# Patient Record
Sex: Male | Born: 1990 | Race: Black or African American | Hispanic: No | Marital: Single | Smoking: Current some day smoker
Health system: Southern US, Community
[De-identification: ages and names within clinical notes are randomized; demographics above are authoritative.]

## PROBLEM LIST (undated history)

## (undated) DIAGNOSIS — G47419 Narcolepsy without cataplexy: Secondary | ICD-10-CM

## (undated) DIAGNOSIS — E669 Obesity, unspecified: Secondary | ICD-10-CM

## (undated) DIAGNOSIS — I10 Essential (primary) hypertension: Secondary | ICD-10-CM

---
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
## 2008-12-01 ENCOUNTER — Emergency Department (HOSPITAL_COMMUNITY): Admission: EM | Admit: 2008-12-01 | Discharge: 2008-12-01 | Payer: Self-pay | Admitting: Emergency Medicine

## 2011-02-04 LAB — URINALYSIS, ROUTINE W REFLEX MICROSCOPIC
Bilirubin Urine: NEGATIVE
Glucose, UA: NEGATIVE mg/dL
Hgb urine dipstick: NEGATIVE
Ketones, ur: NEGATIVE mg/dL
Leukocytes, UA: NEGATIVE
Nitrite: NEGATIVE
Protein, ur: 30 mg/dL — AB
Specific Gravity, Urine: 1.035 — ABNORMAL HIGH (ref 1.005–1.030)
Urobilinogen, UA: 1 mg/dL (ref 0.0–1.0)
pH: 7.5 (ref 5.0–8.0)

## 2011-02-04 LAB — CBC
HCT: 45.2 % (ref 39.0–52.0)
Hemoglobin: 15.8 g/dL (ref 13.0–17.0)
MCHC: 34.9 g/dL (ref 30.0–36.0)
MCV: 89.2 fL (ref 78.0–100.0)
Platelets: 281 10*3/uL (ref 150–400)
RBC: 5.06 MIL/uL (ref 4.22–5.81)
RDW: 13.1 % (ref 11.5–15.5)
WBC: 9.3 10*3/uL (ref 4.0–10.5)

## 2011-02-04 LAB — COMPREHENSIVE METABOLIC PANEL
ALT: 56 U/L — ABNORMAL HIGH (ref 0–53)
AST: 43 U/L — ABNORMAL HIGH (ref 0–37)
Albumin: 3.9 g/dL (ref 3.5–5.2)
Alkaline Phosphatase: 60 U/L (ref 39–117)
BUN: 11 mg/dL (ref 6–23)
CO2: 26 mEq/L (ref 19–32)
Calcium: 9 mg/dL (ref 8.4–10.5)
Chloride: 98 mEq/L (ref 96–112)
Creatinine, Ser: 1.08 mg/dL (ref 0.4–1.5)
GFR calc Af Amer: >60 mL/min (ref >60)
GFR calc non Af Amer: >60 mL/min (ref >60)
Glucose, Bld: 89 mg/dL (ref 70–99)
Potassium: 4 mEq/L (ref 3.5–5.1)
Sodium: 133 mEq/L — ABNORMAL LOW (ref 135–145)
Total Bilirubin: 1.1 mg/dL (ref 0.3–1.2)
Total Protein: 7.4 g/dL (ref 6.0–8.3)

## 2011-02-04 LAB — DIFFERENTIAL
Eosinophils Absolute: 0 10*3/uL (ref 0.0–0.7)
Eosinophils Relative: 0 % (ref 0–5)
Lymphs Abs: 0.4 10*3/uL — ABNORMAL LOW (ref 0.7–4.0)
Monocytes Absolute: 0.2 10*3/uL (ref 0.1–1.0)
Monocytes Relative: 3 % (ref 3–12)

## 2011-02-04 LAB — URINE MICROSCOPIC-ADD ON
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
## 2011-05-20 ENCOUNTER — Emergency Department (HOSPITAL_COMMUNITY): Payer: Self-pay

## 2011-05-20 ENCOUNTER — Emergency Department (HOSPITAL_COMMUNITY)
Admission: EM | Admit: 2011-05-20 | Discharge: 2011-05-20 | Disposition: A | Discharge status: Home / Self Care | Payer: Self-pay | Attending: Cardiology | Admitting: Cardiology

## 2011-05-20 DIAGNOSIS — M2560 Stiffness of unspecified joint, not elsewhere classified: Secondary | ICD-10-CM | POA: Insufficient documentation

## 2011-05-20 DIAGNOSIS — S8000XA Contusion of unspecified knee, initial encounter: Secondary | ICD-10-CM | POA: Insufficient documentation

## 2011-05-20 DIAGNOSIS — S83106A Unspecified dislocation of unspecified knee, initial encounter: Secondary | ICD-10-CM | POA: Insufficient documentation

## 2011-05-20 DIAGNOSIS — F411 Generalized anxiety disorder: Secondary | ICD-10-CM | POA: Insufficient documentation

## 2011-05-20 DIAGNOSIS — IMO0002 Reserved for concepts with insufficient information to code with codable children: Secondary | ICD-10-CM | POA: Insufficient documentation

## 2011-05-20 DIAGNOSIS — Y9351 Activity, roller skating (inline) and skateboarding: Secondary | ICD-10-CM | POA: Insufficient documentation

## 2011-05-20 DIAGNOSIS — M25569 Pain in unspecified knee: Secondary | ICD-10-CM | POA: Insufficient documentation

## 2011-11-28 ENCOUNTER — Emergency Department (HOSPITAL_COMMUNITY)
Admission: EM | Admit: 2011-11-28 | Discharge: 2011-11-29 | Disposition: A | Discharge status: Home / Self Care | Payer: Self-pay | Attending: Emergency Medicine | Admitting: Emergency Medicine

## 2011-11-28 ENCOUNTER — Encounter (HOSPITAL_COMMUNITY): Payer: Self-pay | Admitting: *Deleted

## 2011-11-28 DIAGNOSIS — I10 Essential (primary) hypertension: Secondary | ICD-10-CM | POA: Insufficient documentation

## 2011-11-28 DIAGNOSIS — K0889 Other specified disorders of teeth and supporting structures: Secondary | ICD-10-CM

## 2011-11-28 DIAGNOSIS — K089 Disorder of teeth and supporting structures, unspecified: Secondary | ICD-10-CM | POA: Insufficient documentation

## 2011-11-28 HISTORY — DX: Essential (primary) hypertension: I10

## 2011-11-28 NOTE — ED Notes (Signed)
Pt stated that he took 8 500mg  Tylenol at once this evening for tooth pain. Had bottle with him. Labeled and kept at desk. Reported to nurse.

## 2011-11-28 NOTE — ED Notes (Signed)
The pt has had a toothache for 3 days 

## 2011-11-29 MED ORDER — TRAMADOL HCL 50 MG PO TABS: 50.0000 mg | ORAL_TABLET | Freq: Four times a day (QID) | ORAL | Status: AC | PRN

## 2011-11-29 NOTE — ED Provider Notes (Signed)
History     CSN: [id number]  Arrival date & time 11/28/11  2336   First MD Initiated Contact with Patient 11/29/11 0004      Chief Complaint  Patient presents with  . Dental Pain    (Consider location/radiation/quality/duration/timing/severity/associated sxs/prior treatment) HPI Comments: Patient complaining of bilateral lower wisdom tooth pain in uninterrupted wisdom teeth for a while tonight decided to take sleepy and alcohol which did not  Patient is a 21 y.o. male presenting with tooth pain. The history is provided by the patient.  Dental PainThe primary symptoms include mouth pain. Primary symptoms do not include fever. The symptoms began 5 to 7 days ago. The symptoms are unchanged. The symptoms occur constantly.    Past Medical History  Diagnosis Date  . Hypertension     History reviewed. No pertinent past surgical history.  History reviewed. No pertinent family history.  History  Substance Use Topics  . Smoking status: Current Everyday Smoker  . Smokeless tobacco: Not on file  . Alcohol Use: Yes      Review of Systems  Constitutional: Negative for fever.  HENT: Positive for dental problem.   Genitourinary: Negative for dysuria.  Neurological: Negative for dizziness and weakness.    Allergies  Review of patient's allergies indicates no known allergies.  Home Medications   Current Outpatient Rx  Name Route Sig Dispense Refill  . ACETAMINOPHEN 500 MG PO TABS Oral Take 500 mg by mouth daily as needed. For tooth pain    . ALEVE PO Oral Take 1-2 tablets by mouth daily as needed. For pain      BP 136/92  Pulse 61  Temp(Src) 98.1 F (36.7 C) (Oral)  Resp 15  SpO2 100%  Physical Exam  Constitutional: He appears well-developed and well-nourished.  HENT:  Head: Normocephalic.  Mouth/Throat:    Eyes: Pupils are equal, round, and reactive to light.  Cardiovascular: Normal rate.   Pulmonary/Chest: Effort normal.  Abdominal: Soft.    ED Course    Procedures (including critical care time)  Labs Reviewed - No data to display No results found.   No diagnosis found.    MDM  Wisdom tooth pain, uninterrupted        Arman Filter, NP 11/29/11 0056  Arman Filter, NP 11/29/11 [phone]

## 2011-11-29 NOTE — ED Provider Notes (Signed)
Medical screening examination/treatment/procedure(s) were performed by non-physician practitioner and as supervising physician I was immediately available for consultation/collaboration.  Ajani Schnieders K Roseann Kees-Rasch, MD 11/29/11 0700 

## 2013-02-01 IMAGING — CR DG KNEE COMPLETE 4+V*R*
4 series · 4 of 4 positions shown · non-contrast
Comparison: None.

CLINICAL DATA: Right lateral knee pain after fall.

RIGHT KNEE - COMPLETE 4+ VIEW

[t knee ap right]
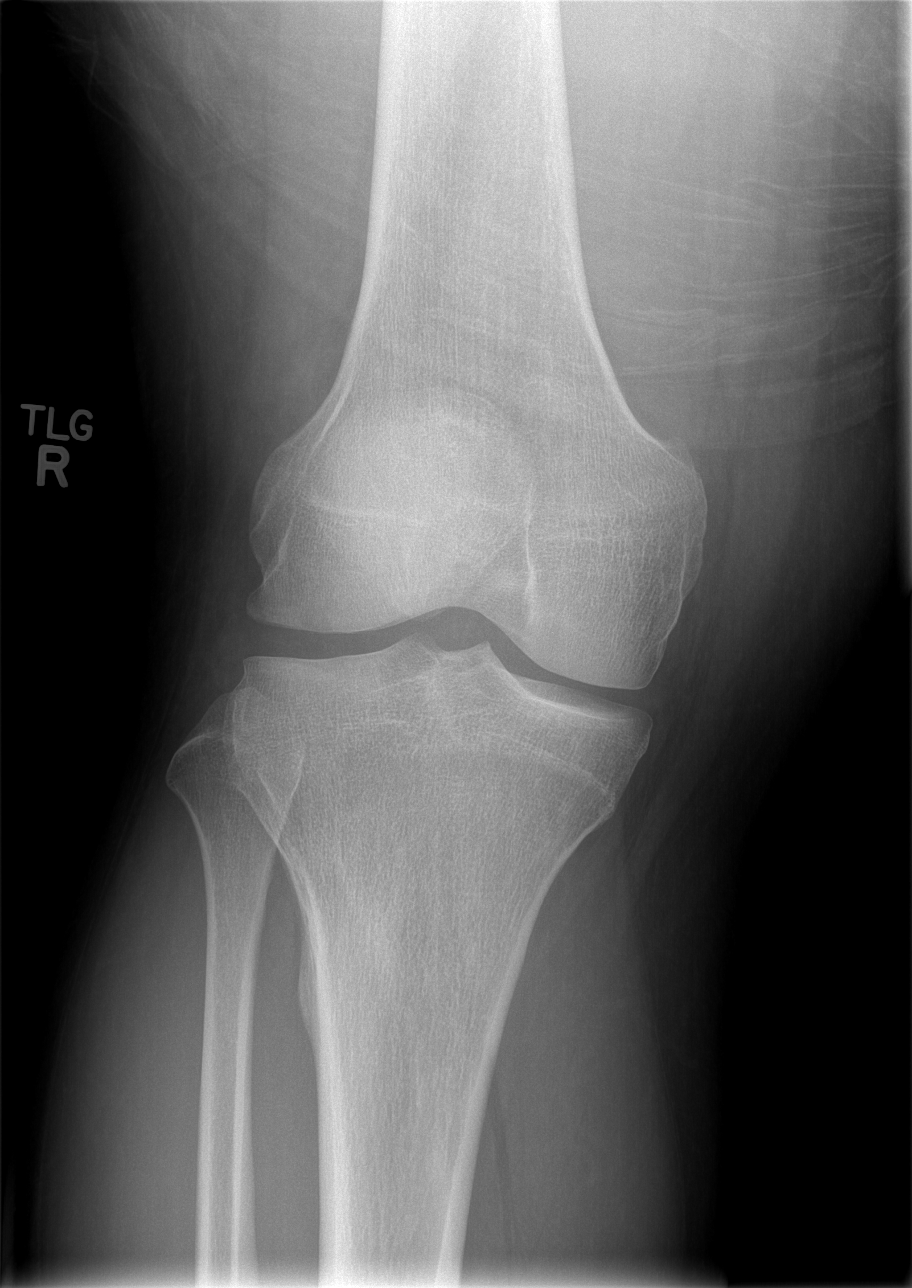

[t knee oblique right * (1 of 2)]
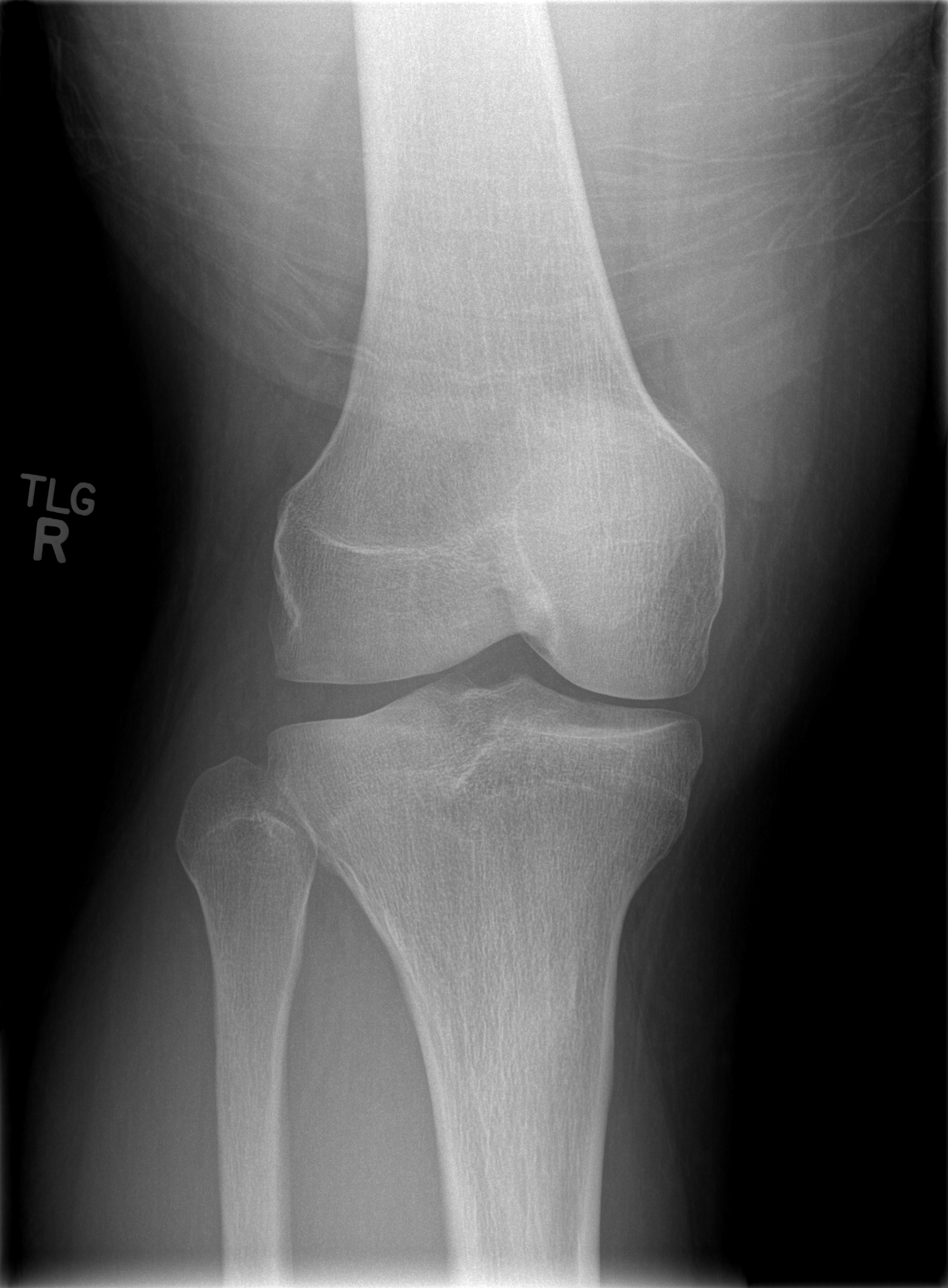

[t knee oblique right * (2 of 2)]
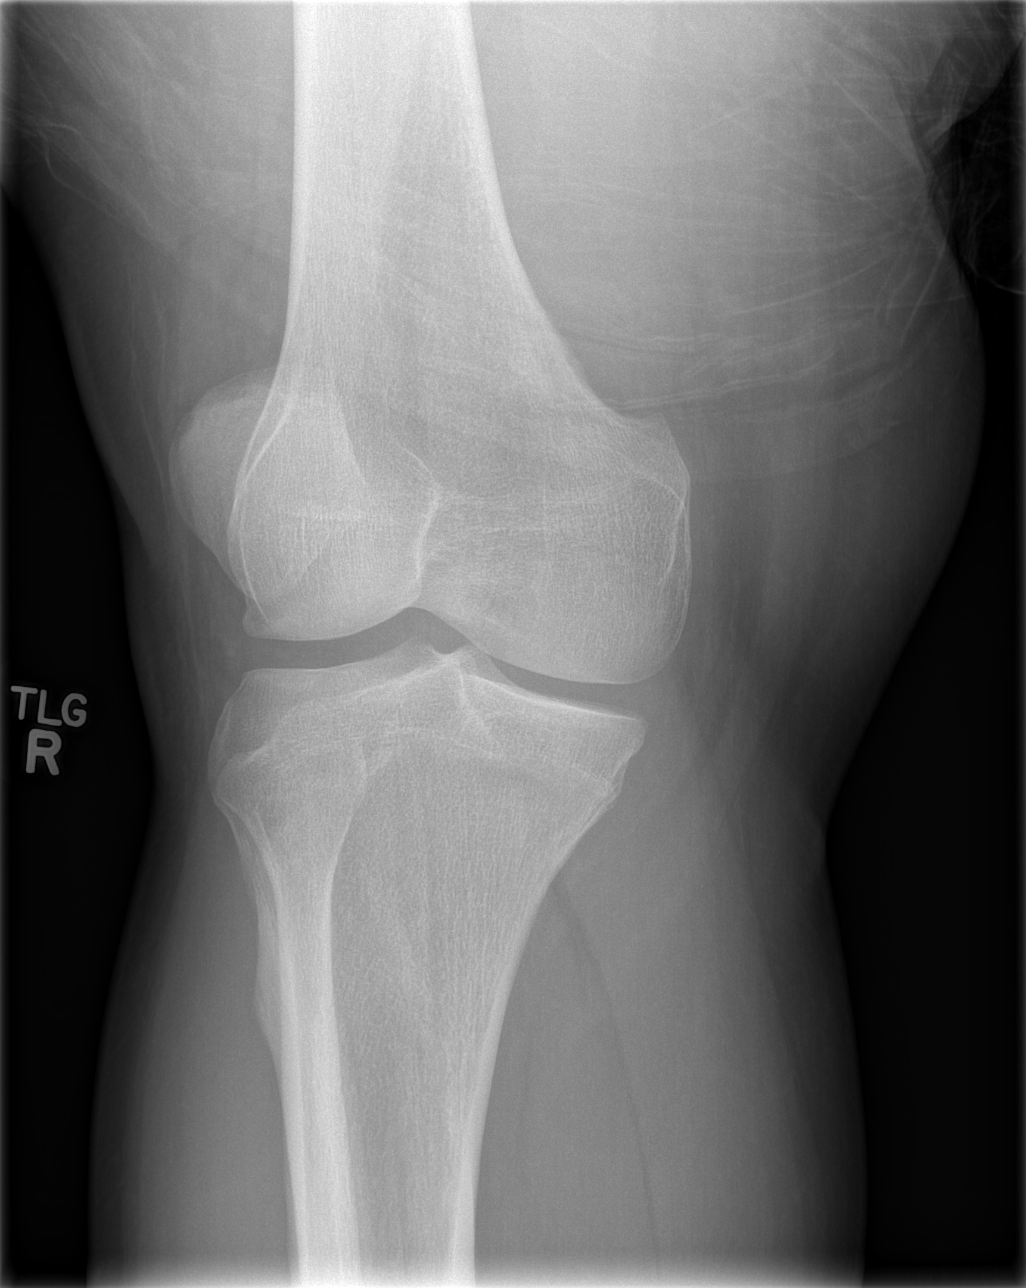

[t knee lat right *]
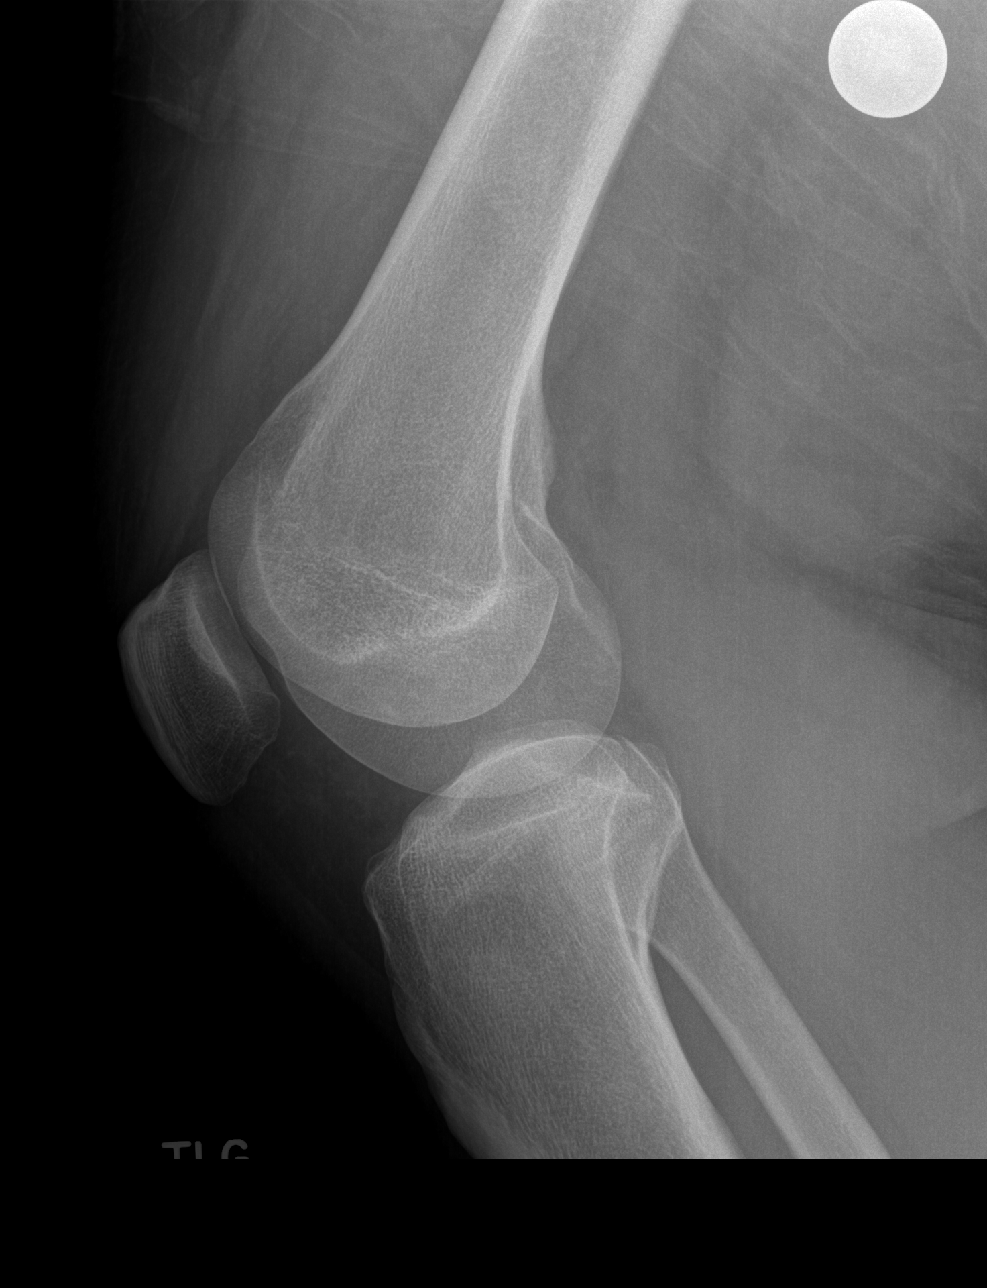

[4 of 4 positions shown; findings below may reference images not displayed]

FINDINGS: No evidence of acute fracture or subluxation.  No focal
bone lesions.  Bone matrix and cortex appear intact.  No abnormal
radiopaque densities in the soft tissues.  No evidence of
effusion.
IMPRESSION: No acute bony abnormalities identified.

## 2013-11-13 ENCOUNTER — Encounter (HOSPITAL_COMMUNITY): Payer: Self-pay | Admitting: Emergency Medicine

## 2013-11-13 ENCOUNTER — Emergency Department (HOSPITAL_COMMUNITY)
Admission: EM | Admit: 2013-11-13 | Discharge: 2013-11-13 | Disposition: A | Discharge status: Home / Self Care | Payer: No Typology Code available for payment source | Attending: Emergency Medicine | Admitting: Emergency Medicine

## 2013-11-13 DIAGNOSIS — F17200 Nicotine dependence, unspecified, uncomplicated: Secondary | ICD-10-CM | POA: Insufficient documentation

## 2013-11-13 DIAGNOSIS — K089 Disorder of teeth and supporting structures, unspecified: Secondary | ICD-10-CM | POA: Insufficient documentation

## 2013-11-13 DIAGNOSIS — K0889 Other specified disorders of teeth and supporting structures: Secondary | ICD-10-CM

## 2013-11-13 DIAGNOSIS — I10 Essential (primary) hypertension: Secondary | ICD-10-CM | POA: Insufficient documentation

## 2013-11-13 DIAGNOSIS — K029 Dental caries, unspecified: Secondary | ICD-10-CM | POA: Insufficient documentation

## 2013-11-13 MED ORDER — PENICILLIN V POTASSIUM 500 MG PO TABS: 500.0000 mg | ORAL_TABLET | Freq: Three times a day (TID) | ORAL | Status: DC

## 2013-11-13 MED ORDER — OXYCODONE-ACETAMINOPHEN 5-325 MG PO TABS: 2.0000 | ORAL_TABLET | ORAL | Status: DC | PRN

## 2013-11-13 NOTE — ED Notes (Signed)
Pt reports dental pain x 3days. He stated "I bit into something wrong and broke a filling on the bottom right side."

## 2013-11-13 NOTE — ED Provider Notes (Signed)
CSN: [id number]     Arrival date & time 11/13/13  0502 History   First MD Initiated Contact with Patient 11/13/13 [phone]     Chief Complaint  Patient presents with  . Dental Pain   (Consider location/radiation/quality/duration/timing/severity/associated sxs/prior Treatment) Patient is a 23 y.o. male presenting with tooth pain. The history is provided by the patient.  Dental Pain Location:  Lower Lower teeth location:  32/RL 3rd molar and 30/RL 1st molar Quality:  Throbbing Severity:  Moderate Onset quality:  Gradual Duration:  3 days Timing:  Constant Progression:  Worsening Chronicity:  New Context: filling fell out   Relieved by:  Nothing Worsened by:  Nothing tried Ineffective treatments:  None tried Associated symptoms: no fever     Past Medical History  Diagnosis Date  . Hypertension    History reviewed. No pertinent past surgical history. No family history on file. History  Substance Use Topics  . Smoking status: Current Every Day Smoker  . Smokeless tobacco: Not on file  . Alcohol Use: Yes    Review of Systems  Constitutional: Negative for fever.  All other systems reviewed and are negative.    Allergies  Review of patient's allergies indicates no known allergies.  Home Medications   Current Outpatient Rx  Name  Route  Sig  Dispense  Refill  . acetaminophen (TYLENOL) 500 MG tablet   Oral   Take 500 mg by mouth daily as needed. For tooth pain         . Naproxen Sodium (ALEVE PO)   Oral   Take 1-2 tablets by mouth daily as needed. For pain          BP 157/92  Pulse 76  Temp(Src) 97.4 F (36.3 C) (Oral)  Resp 16  SpO2 95% Physical Exam  Nursing note and vitals reviewed. Constitutional: He is oriented to person, place, and time. He appears well-developed and well-nourished. No distress.  HENT:  Head: Normocephalic and atraumatic.  Mouth/Throat: Oropharynx is clear and moist.  The right lower first molar is heavily decayed and is missing  the filling the patient states was there previously. He also has inflammation of the gum overlying the impacted third molar.  Neck: Normal range of motion. Neck supple.  Musculoskeletal: Normal range of motion.  Lymphadenopathy:    He has no cervical adenopathy.  Neurological: He is alert and oriented to person, place, and time.  Skin: Skin is warm and dry. He is not diaphoretic.    ED Course  Procedures (including critical care time) Labs Review Labs Reviewed - No data to display Imaging Review No results found.    MDM  No diagnosis found. We'll treat with antibiotics and pain medication and followup with dentistry.    Geoffery Lyonsouglas Kunaal Walkins, MD 11/13/13 [phone]

## 2013-11-13 NOTE — Discharge Instructions (Signed)
Penicillin as prescribed.  Percocet as prescribed as needed for pain.  Followup with dentistry in the next 2-3 days.   Dental Pain A tooth ache may be caused by cavities (tooth decay). Cavities expose the nerve of the tooth to air and hot or cold temperatures. It may come from an infection or abscess (also called a boil or furuncle) around your tooth. It is also often caused by dental caries (tooth decay). This causes the pain you are having. DIAGNOSIS  Your caregiver can diagnose this problem by exam. TREATMENT   If caused by an infection, it may be treated with medications which kill germs (antibiotics) and pain medications as prescribed by your caregiver. Take medications as directed.  Only take over-the-counter or prescription medicines for pain, discomfort, or fever as directed by your caregiver.  Whether the tooth ache today is caused by infection or dental disease, you should see your dentist as soon as possible for further care. SEEK MEDICAL CARE IF: The exam and treatment you received today has been provided on an emergency basis only. This is not a substitute for complete medical or dental care. If your problem worsens or new problems (symptoms) appear, and you are unable to meet with your dentist, call or return to this location. SEEK IMMEDIATE MEDICAL CARE IF:   You have a fever.  You develop redness and swelling of your face, jaw, or neck.  You are unable to open your mouth.  You have severe pain uncontrolled by pain medicine. MAKE SURE YOU:   Understand these instructions.  Will watch your condition.  Will get help right away if you are not doing well or get worse. Document Released: 10/06/2005 Document Revised: 12/29/2011 Document Reviewed: 05/24/2008 [facility] Patient Information 2014 [place], [place].    Emergency Department Resource Guide 1) Find a Doctor and Pay Out of Pocket Although you won't have to find out who is covered by your insurance plan, it is a  good idea to ask around and get recommendations. You will then need to call the office and see if the doctor you have chosen will accept you as a new patient and what types of options they offer for patients who are self-pay. Some doctors offer discounts or will set up payment plans for their patients who do not have insurance, but you will need to ask so you aren't surprised when you get to your appointment.  2) Contact Your Local Health Department Not all health departments have doctors that can see patients for sick visits, but many do, so it is worth a call to see if yours does. If you don't know where your local health department is, you can check in your phone book. The CDC also has a tool to help you locate your state's health department, and many state websites also have listings of all of their local health departments.  3) Find a Walk-in Clinic If your illness is not likely to be very severe or complicated, you may want to try a walk in clinic. These are popping up all over the country in pharmacies, drugstores, and shopping centers. They're usually staffed by nurse practitioners or physician assistants that have been trained to treat common illnesses and complaints. They're usually fairly quick and inexpensive. However, if you have serious medical issues or chronic medical problems, these are probably not your best option.  No Primary Care Doctor: - Call Health Connect at  [phone] - they can help you locate a primary care doctor that  accepts  your insurance, provides certain services, etc. - Physician Referral Service- [phone]  Chronic Pain Problems: Organization         Address  Phone   Notes  [facility]  [phone] Patients need to be referred by their primary care doctor.   Medication Assistance: Organization         Address  Phone   Notes  [facility] Medication [facility] [address] [place], [place] [address] [phone] --Must be a resident of [facility] -- Must have NO insurance coverage whatsoever (no Medicaid/ Medicare, etc.) -- The pt. MUST have a primary care doctor that directs their care regularly and follows them in the community   [facility]  [phone]   [employer]  [phone]    Agencies that provide inexpensive medical care: Organization         Address  Phone   Notes  [facility]  [phone]   [facility]    [phone]   [facility] [address] [place], [place] [address] [phone]   [facility] [address] [place] [place] [phone]   Planned Parenthood    [phone]   [facility]    [phone]   Community Health and [facility]  [address], [place] Phone:  [phone], Fax:  [phone] Hours of Operation:  9 am - 6 pm, M-F.  Also accepts Medicaid/Medicare and self-pay.  [facility]  [address], [place] Phone: [phone], Fax: [phone]. Hours of Operation:  8:30 am - 5:30 pm, M-F.  Also accepts Medicaid and self-pay.  [facility] [place] [address] [place] Phone: [phone]   [facility] [address] Natasha Bence [place], [place] [phone], Ext. [phone] Mondays & Thursdays: 7-9 AM.  First 15 patients are seen on a first come, first serve basis.    Medicaid-accepting [facility] [place] Providers:  Organization         Address  Phone   Notes  [facility] [address], [place] [phone] Also accepts self-pay patients.  [facility] [address] Laurell Josephs [place], [place]  [phone]   [facility] [address], [place] [phone]   [facility] [address], [place] [phone]   Renaye Rakers [address], [place]   [phone] Only accepts [place] patients after they have their name applied to their card.   Self-Pay (no insurance) in [facility]:  Organization         Address  Phone   Notes  Sickle Cell Patients, [facility] [address] [place], [place] [phone]   [facility] [address] [place], [place] [phone]   [facility] [place]  [address], [place] [phone]   [facility]/Dr. Osei-Bonsu  [address], [place] or [address], [place] [phone] Phone number for both [place] and [place] locations is the same.  [facility] and [facility] [address] [place] [phone]   [facility] [address] [place] or [address] [phone]   [facility]  [address], [place] [phone], phone; [phone], fax Sees patients 1st and 3rd Saturday of every month.  Must not qualify for public or private insurance (i.e. Medicaid, Medicare, [place] Health Choice, Veterans' Benefits)  Household income should be no more than 200% of the poverty level The clinic cannot treat you if you are pregnant or think you are pregnant  Sexually transmitted diseases are not treated at the clinic.    Dental Care: Organization         Address  Phone  Notes  [facility] [address], [place] [phone] Accepts children up to age 25 who are enrolled in [place] or [place] Health Choice; pregnant women with a Medicaid card; and children who have applied for Medicaid or [place] Health Choice, but were declined, whose parents can pay a reduced fee at time of service.  [facility] [place]  [address], [place] [phone] Accepts children up to age 25 who are enrolled in [place] or [place] [facility]  Choice; pregnant women with a Medicaid card; and children who have applied for Medicaid or [place] Health Choice, but were declined, whose parents can pay a reduced fee at time of service.  [facility]  [address], [place] [phone] Patients are seen by appointment only. Walk-ins are not accepted. [facility] will see patients 25 years of age and older. Monday - Tuesday (8am-5pm) Most Wednesdays (8:30-5pm) $30 per visit, cash only  [facility]  [address], [facility] [phone] Patients are seen by appointment only. Walk-ins are not accepted. [facility] will see patients 25 years of age and older. One Wednesday Evening (Monthly: Volunteer Based).  $30 per visit, cash only  [employer] [facility] [facility]  [phone] for adults; Children under age 704, call Graduate Pediatric Dentistry at [phone]. Children aged 104-14, please call [phone] to request a pediatric application.  Dental services are provided in all areas of dental care including fillings, crowns and bridges, complete and partial dentures, implants, gum treatment, root canals, and extractions. Preventive care is also provided. Treatment is provided to both adults and children. Patients are selected via a lottery and there is often a waiting list.   [facility] [address], [place]  [phone]) [phone] [email or web address]   [facility] [address], [place], [place] [phone], Ext. [phone] Second and Fourth Thursday of each month, opens at 6:30 AM; Clinic ends at 9 AM.  Patients are seen on a first-come first-served basis, and a limited number are seen during each clinic.   [facility]  [address], [place], [place] [phone]   Eligibility Requirements You must have lived in [place], [place], or [place] counties for at least the last three months.   You cannot be eligible for state or  federal sponsored [place]healthcare insurance, including CIGNAVeterans Administration, [place]icaid, or [employer]icare.   You generally cannot be eligible for healthcare insurance through your employer.    How to apply: Eligibility screenings are held every Tuesday and Wednesday afternoon from 1:00 pm until 4:00 pm. You do not need an appointment for the interview!  [facility] [address], [place], [place] [phone]   [facility]  [phone]   [facility]  [phone]   [facility]  [phone]  Behavioral Health Resources in the Community: °Intensive Outpatient Programs °Organization         Address  Phone  Notes  °[place] [facility] [address], [place], [place] [phone]   °[facility] [address], [place], [place] [phone]   °[facility] [address], [place], [place] ° 336-882-2125   °[facility] [address],  °[place], [place] 1-800-853-5163 or 336-641-4981   °Substance Abuse Resources °Organization         Address  Phone  Notes  °[facility]  336-882-2125   °[facility]  336-784-9470   °[facility]  336-285-9073   °[facility]  336-845-3988   °Residential & Outpatient Substance Abuse Program  1-800-659-3381   °Psychological Services °Organization         Address  Phone  Notes  °[facility]  336- [phone]   °[facility]  336- 378-7881   °[facility] [address], [place] 1-800-853-5163 or 336-641-4981   ° °Mobile Crisis Teams °Organization         Address  Phone  Notes  °[facility], Mobile Crisis Care Unit  1-877-626-1772   °Assertive °Psychotherapeutic Services ° [address] [place], [place] 336-834-9664   °Sharon DeEsch [address] °[place] 336-554-5454   ° °Self-Help/Support Groups °Organization         Address  Phone              Notes  °[facility] [place] - variety of support groups  336- 373-1402 Call for more information  °Narcotics Anonymous (NA), [facility] [address], °[place]  2 meetings at this location  ° °Residential Treatment Programs °Organization         Address  Phone  Notes  °[facility] [address],    °[place]  1-866-801-8205   °[facility] ° [address], [place], [place] [phone]   °[facility] [address], [place] 336-845-3988 Admissions: 8am-3pm M-F  °[facility] [address]    °[place], [place] 336-841-1104   °[facility] [address], [place], [place] 336-379-7146   °[facility] [address]  °[place], [place] 336-285-9073   °[facility] - Intensive Outpatient [address], [place], [place] 336-852-3033   °[facility] ([facility] [address]  °[place], [place] 1-877-615-2722 or 336-784-9470   °[facility] ([facility]) [address] [place], [place] 336-227-7417 Accepts Medicaid  °[facility] [address]  ° [place] 1-800-659-3381 Substance Abuse/Addiction Treatment  ° °[place] Behavioral Health Resources °Organization         Address  Phone  Notes  °[facility]  (888) 581-9988   °Julie Brannon, PhD [address] [place], [place]   (336) 349-5553 or (336) 951-0000   °[facility]   [address] °[place], [place] (336) 349-4454   °[facility] [address], [place],  (336) 342-8316 Insurance/Medicaid/sponsorship through [facility]  °[facility] [address]                                    [place],  (336) [phone] Therapy/tele-psych/case  °[facility] [address]  ° ,   [place] [phone]    Dr. Lolly Mustache  [phone]   [facility]  [facility]  Dept. 1) [address], [place] 2) [address] [place] 3)  [address], [place] [phone]  [phone]   [facility] Child Abuse Hotline 248-763-7218 or [phone] (After Hours)

## 2014-04-21 ENCOUNTER — Encounter (HOSPITAL_COMMUNITY): Payer: Self-pay | Admitting: Emergency Medicine

## 2014-04-21 ENCOUNTER — Emergency Department (HOSPITAL_COMMUNITY)
Admission: EM | Admit: 2014-04-21 | Discharge: 2014-04-21 | Disposition: A | Discharge status: Home / Self Care | Payer: No Typology Code available for payment source | Attending: Emergency Medicine | Admitting: Emergency Medicine

## 2014-04-21 DIAGNOSIS — K089 Disorder of teeth and supporting structures, unspecified: Secondary | ICD-10-CM | POA: Insufficient documentation

## 2014-04-21 DIAGNOSIS — F17200 Nicotine dependence, unspecified, uncomplicated: Secondary | ICD-10-CM | POA: Insufficient documentation

## 2014-04-21 DIAGNOSIS — E669 Obesity, unspecified: Secondary | ICD-10-CM | POA: Insufficient documentation

## 2014-04-21 DIAGNOSIS — I10 Essential (primary) hypertension: Secondary | ICD-10-CM | POA: Insufficient documentation

## 2014-04-21 DIAGNOSIS — G47419 Narcolepsy without cataplexy: Secondary | ICD-10-CM | POA: Insufficient documentation

## 2014-04-21 DIAGNOSIS — K029 Dental caries, unspecified: Secondary | ICD-10-CM | POA: Insufficient documentation

## 2014-04-21 DIAGNOSIS — K0889 Other specified disorders of teeth and supporting structures: Secondary | ICD-10-CM

## 2014-04-21 HISTORY — DX: Narcolepsy without cataplexy: G47.419

## 2014-04-21 HISTORY — DX: Obesity, unspecified: E66.9

## 2014-04-21 MED ORDER — HYDROCODONE-ACETAMINOPHEN 5-325 MG PO TABS: ORAL_TABLET | ORAL | Status: DC

## 2014-04-21 MED ORDER — PENICILLIN V POTASSIUM 500 MG PO TABS: 500.0000 mg | ORAL_TABLET | Freq: Three times a day (TID) | ORAL | Status: DC

## 2014-04-21 MED ORDER — NAPROXEN 500 MG PO TABS: 500.0000 mg | ORAL_TABLET | Freq: Two times a day (BID) | ORAL | Status: DC

## 2014-04-21 NOTE — ED Provider Notes (Signed)
Medical screening examination/treatment/procedure(s) were performed by non-physician practitioner and as supervising physician I was immediately available for consultation/collaboration.   EKG Interpretation None       Doug SouSam Lavinia Mcneely, MD 04/21/14 [phone]

## 2014-04-21 NOTE — ED Notes (Signed)
Pt A&OX4, ambulatory at d/c with steady gait, NAD, declined wheelchair. 

## 2014-04-21 NOTE — ED Notes (Signed)
Pt. reports right upper/lower molar pain onset yesterday unrelieved by OTC pain medication .

## 2014-04-21 NOTE — ED Provider Notes (Signed)
CSN: [id number]     Arrival date & time 04/21/14  0137 History   First MD Initiated Contact with Patient 04/21/14 0234     No chief complaint on file.    (Consider location/radiation/quality/duration/timing/severity/associated sxs/prior Treatment) HPI Comments: Patient presents with complaint of dental pain that began 2 days ago and has gradually become worse. Patient states the pain is in his right molars. He is taken ibuprofen without relief. No facial or neck swelling. No trouble breathing. No fever. Patient has had pain in this area in the past. He has a Education officer, communitydentist.   The history is provided by the patient.    Past Medical History  Diagnosis Date  . Hypertension   . Obesity   . Narcolepsy    History reviewed. No pertinent past surgical history. No family history on file. History  Substance Use Topics  . Smoking status: Current Every Day Smoker  . Smokeless tobacco: Not on file  . Alcohol Use: Yes    Review of Systems  Constitutional: Negative for fever.  HENT: Positive for dental problem. Negative for ear pain, facial swelling, sore throat and trouble swallowing.   Respiratory: Negative for shortness of breath and stridor.   Musculoskeletal: Negative for neck pain.  Skin: Negative for color change.  Neurological: Negative for headaches.      Allergies  Review of patient's allergies indicates no known allergies.  Home Medications   Prior to Admission medications   Medication Sig Start Date End Date Taking? Authorizing Provider  HYDROcodone-acetaminophen (NORCO/VICODIN) 5-325 MG per tablet Take 1-2 tablets every 6 hours as needed for severe pain 04/21/14   Renne CriglerJoshua Solace Wendorff, PA-C  naproxen (NAPROSYN) 500 MG tablet Take 1 tablet (500 mg total) by mouth 2 (two) times daily. 04/21/14   Renne CriglerJoshua Essie Gehret, PA-C  penicillin v potassium (VEETID) 500 MG tablet Take 1 tablet (500 mg total) by mouth 3 (three) times daily. 04/21/14   Renne CriglerJoshua Hesper Venturella, PA-C   BP 135/81  Pulse 80  Temp(Src) 98.7  F (37.1 C) (Oral)  Resp 22  Ht 5\' 9"  (1.753 m)  Wt 333 lb (151.048 kg)  BMI 49.15 kg/m2  SpO2 97% Physical Exam  Nursing note and vitals reviewed. Constitutional: He appears well-developed and well-nourished.  HENT:  Head: Normocephalic and atraumatic.  Right Ear: Tympanic membrane, external ear and ear canal normal.  Left Ear: Tympanic membrane, external ear and ear canal normal.  Nose: Nose normal.  Mouth/Throat: Uvula is midline, oropharynx is clear and moist and mucous membranes are normal. No trismus in the jaw. Abnormal dentition. Dental caries present. No dental abscesses or uvula swelling. No tonsillar abscesses.  Patient with R maxillary/mandibular tooth pain and tenderness to palpation in area of posterior molars. Mild gingival swelling and erythema noted on exam. Teeth are in poor repair. Multiple filled and broken teeth.  Eyes: Pupils are equal, round, and reactive to light.  Neck: Normal range of motion. Neck supple.  No neck swelling or Lugwig's angina  Neurological: He is alert.  Skin: Skin is warm and dry.  Psychiatric: He has a normal mood and affect.    ED Course  Procedures (including critical care time) Labs Review Labs Reviewed - No data to display  Imaging Review No results found.   EKG Interpretation None      2:46 AM Patient seen and examined. Work-up initiated. Medications ordered.   Vital signs reviewed and are as follows: Filed Vitals:   04/21/14 0235  BP: 135/81  Pulse: 80  Temp:  Resp: 22    Patient counseled to take prescribed medications as directed, return with worsening facial or neck swelling, and to follow-up with their dentist as soon as possible.   Patient counseled on use of narcotic pain medications. Counseled not to combine these medications with others containing tylenol. Urged not to drink alcohol, drive, or perform any other activities that requires focus while taking these medications. The patient verbalizes  understanding and agrees with the plan.    MDM   Final diagnoses:  Toothache   Patient with toothache. No fever. Exam unconcerning for Ludwig's angina or other deep tissue infection in neck.   As there is gum swelling, erythema, will treat with antibiotic and pain medicine. Urged patient to follow-up with dentist.      Renne CriglerJoshua Duc Crocket, PA-C 04/21/14 [id number]

## 2014-04-21 NOTE — Discharge Instructions (Signed)
Please read and follow all provided instructions.  Your diagnoses today include:  1. Toothache     The exam and treatment you received today has been provided on an emergency basis only. This is not a substitute for complete medical or dental care.  Tests performed today include:  Vital signs. See below for your results today.   Medications prescribed:   Vicodin (hydrocodone/acetaminophen) - narcotic pain medication  DO NOT drive or perform any activities that require you to be awake and alert because this medicine can make you drowsy. BE VERY CAREFUL not to take multiple medicines containing Tylenol (also called acetaminophen). Doing so can lead to an overdose which can damage your liver and cause liver failure and possibly death.   Penicillin - antibiotic  You have been prescribed an antibiotic medicine: take the entire course of medicine even if you are feeling better. Stopping early can cause the antibiotic not to work.   Naproxen - anti-inflammatory pain medication  Do not exceed 500mg  naproxen every 12 hours, take with food  You have been prescribed an anti-inflammatory medication or NSAID. Take with food. Take smallest effective dose for the shortest duration needed for your pain. Stop taking if you experience stomach pain or vomiting.   Take any prescribed medications only as directed.  Home care instructions:  Follow any educational materials contained in this packet.  Follow-up instructions: Please follow-up with your dentist for further evaluation of your symptoms.   Dental Assistance: See below for dental referrals  Return instructions:   Please return to the Emergency Department if you experience worsening symptoms.  Please return if you develop a fever, you develop more swelling in your face or neck, you have trouble breathing or swallowing food.  Please return if you have any other emergent concerns.  Additional Information:  Your vital signs today  were: BP 135/81   Pulse 80   Temp(Src) 98.7 F (37.1 C) (Oral)   Resp 22   Ht 5\' 9"  (1.753 m)   Wt 333 lb (151.048 kg)   BMI 49.15 kg/m2   SpO2 97% If your blood pressure (BP) was elevated above 135/85 this visit, please have this repeated by your doctor within one month. -------------- Dental Care: Organization         Address  Phone  Notes  [facility] [address], [place] [phone] Accepts children up to age 25 who are enrolled in [place] or [place] Health Choice; pregnant women with a Medicaid card; and children who have applied for Medicaid or [place] Health Choice, but were declined, whose parents can pay a reduced fee at time of service.  [facility] [place]  [address], [place] [phone] Accepts children up to age 25 who are enrolled in [place] or [place] Health Choice; pregnant women with a Medicaid card; and children who have applied for Medicaid or [place] Health Choice, but were declined, whose parents can pay a reduced fee at time of service.  [facility]  [address], [place] [phone] Patients are seen by appointment only. Walk-ins are not accepted. [facility] will see patients 25 years of age and older. Monday - Tuesday (8am-5pm) Most Wednesdays (8:30-5pm) $30 per visit, cash only  [facility]  [address], [facility] [phone] Patients are seen by appointment only. Walk-ins are not accepted. [facility] will see  patients 31 years of age and older. One Wednesday Evening (Monthly: Volunteer Based).  $30 per visit, cash only  [place]  [phone] for adults; Children under age 87, call Graduate Pediatric Dentistry at [phone]. Children aged 13-14, please call [phone] to request a pediatric application.  Dental services are provided in all areas of dental  care including fillings, crowns and bridges, complete and partial dentures, implants, gum treatment, root canals, and extractions. Preventive care is also provided. Treatment is provided to both adults and children. Patients are selected via a lottery and there is often a waiting list.   [facility] [address], [place]  [phone] [email or web address]   [facility] [address] [place], [place] [phone], [address]. [phone] Second and Fourth Thursday of each month, opens at 6:30 AM; Clinic ends at 9 AM.  Patients are seen on a first-come first-served basis, and a limited number are seen during each clinic.   [facility]  [address] [place], [place] [phone]   Eligibility Requirements You must have lived in [place], [place], or [place] counties for at least the last three months.   You cannot be eligible for state or federal sponsored [employer], including [employer], [place], or [employer].   You generally cannot be eligible for healthcare insurance through your employer.    How to apply: Eligibility screenings are held every Tuesday and Wednesday afternoon from 1:00 pm until 4:00 pm. You do not need an appointment for the interview!  [facility] [address] [place], [place]   [place]  [place]  [place]  [phone]

## 2015-04-10 ENCOUNTER — Encounter (HOSPITAL_COMMUNITY): Payer: Self-pay | Admitting: Emergency Medicine

## 2015-04-10 ENCOUNTER — Emergency Department (HOSPITAL_COMMUNITY)
Admission: EM | Admit: 2015-04-10 | Discharge: 2015-04-10 | Disposition: A | Discharge status: Home / Self Care | Payer: No Typology Code available for payment source | Attending: Emergency Medicine | Admitting: Emergency Medicine

## 2015-04-10 DIAGNOSIS — S0501XA Injury of conjunctiva and corneal abrasion without foreign body, right eye, initial encounter: Secondary | ICD-10-CM | POA: Diagnosis not present

## 2015-04-10 DIAGNOSIS — Z791 Long term (current) use of non-steroidal anti-inflammatories (NSAID): Secondary | ICD-10-CM | POA: Insufficient documentation

## 2015-04-10 DIAGNOSIS — H5711 Ocular pain, right eye: Secondary | ICD-10-CM | POA: Diagnosis present

## 2015-04-10 DIAGNOSIS — Y999 Unspecified external cause status: Secondary | ICD-10-CM | POA: Insufficient documentation

## 2015-04-10 DIAGNOSIS — E669 Obesity, unspecified: Secondary | ICD-10-CM | POA: Insufficient documentation

## 2015-04-10 DIAGNOSIS — X58XXXA Exposure to other specified factors, initial encounter: Secondary | ICD-10-CM | POA: Insufficient documentation

## 2015-04-10 DIAGNOSIS — Y929 Unspecified place or not applicable: Secondary | ICD-10-CM | POA: Diagnosis not present

## 2015-04-10 DIAGNOSIS — I10 Essential (primary) hypertension: Secondary | ICD-10-CM | POA: Insufficient documentation

## 2015-04-10 DIAGNOSIS — Y939 Activity, unspecified: Secondary | ICD-10-CM | POA: Diagnosis not present

## 2015-04-10 DIAGNOSIS — Z87891 Personal history of nicotine dependence: Secondary | ICD-10-CM | POA: Diagnosis not present

## 2015-04-10 DIAGNOSIS — H109 Unspecified conjunctivitis: Secondary | ICD-10-CM | POA: Diagnosis not present

## 2015-04-10 DIAGNOSIS — R51 Headache: Secondary | ICD-10-CM | POA: Diagnosis not present

## 2015-04-10 MED ORDER — FLUORESCEIN SODIUM 1 MG OP STRP
1.0000 | ORAL_STRIP | Freq: Once | OPHTHALMIC | Status: AC
  Administered 2015-04-10: 1 via OPHTHALMIC
  Filled 2015-04-10: qty 1

## 2015-04-10 MED ORDER — CIPROFLOXACIN HCL 0.3 % OP SOLN
1.0000 [drp] | OPHTHALMIC | Status: DC
  Administered 2015-04-10: 1 [drp] via OPHTHALMIC
  Filled 2015-04-10: qty 2.5

## 2015-04-10 MED ORDER — TETRACAINE HCL 0.5 % OP SOLN
1.0000 [drp] | Freq: Once | OPHTHALMIC | Status: AC
  Administered 2015-04-10: 1 [drp] via OPHTHALMIC
  Filled 2015-04-10: qty 2

## 2015-04-10 MED ORDER — OXYCODONE-ACETAMINOPHEN 5-325 MG PO TABS: 1.0000 | ORAL_TABLET | Freq: Four times a day (QID) | ORAL | Status: DC | PRN

## 2015-04-10 NOTE — ED Notes (Signed)
Per NP Damian Leavell, this RN made patient an appt with Dr. Joslyn Devon for tomorrow morning at [facility] for follow up of his right eye.

## 2015-04-10 NOTE — ED Notes (Signed)
NP Neese at bedside. 

## 2015-04-10 NOTE — ED Provider Notes (Signed)
CSN: [id number]     Arrival date & time 04/10/15  1419 History  This chart was scribed for non-physician practitioner Kerrie Buffalo, NP working with Elwin Mocha, MD by Lyndel Safe, ED Scribe. This patient was seen in room TR06C/TR06C and the patient's care was started at 3:19 PM.    Chief Complaint  Patient presents with  . Eye Pain   Patient is a 24 y.o. male presenting with eye pain. The history is provided by the patient. No language interpreter was used.  Eye Pain This is a new problem. The current episode started 2 days ago. The problem occurs constantly. The problem has been gradually worsening. Associated symptoms include headaches. Nothing aggravates the symptoms. Nothing relieves the symptoms. The treatment provided no relief.   HPI Comments: Elijah Henderson is a 24 y.o. male who presents to the Emergency Department complaining of constant, moderate right eye irritation and swelling onset 2 days ago with worsening pain, redness, and drainage onset 1 day ago. The pt wears correctional lenses. He reports he was at work 2 days ago when his right eye started feeling irritated with an associated drainage of white/yellow mucous and headache, his boss then sent him home and he took the his contacts lenses out. This morning he reports he could not open his right eye due to the drainage. He has tried taking zyrtec with no relief. Pt is not followed by an ophthalmologist regularly but states he goes to [facility].The pt notes he has watery drainage in bilateral eyes at baseline. He denies changes in vision, double vision, blurred vision.  Past Medical History  Diagnosis Date  . Hypertension   . Obesity   . Narcolepsy    History reviewed. No pertinent past surgical history. No family history on file. History  Substance Use Topics  . Smoking status: Former Games developer  . Smokeless tobacco: Not on file  . Alcohol Use: Yes    Review of Systems  Eyes: Positive for pain, discharge and  redness. Negative for visual disturbance.  Neurological: Positive for headaches.  All other systems reviewed and are negative.  Allergies  Review of patient's allergies indicates no known allergies.  Home Medications   Prior to Admission medications   Medication Sig Start Date End Date Taking? Authorizing Provider  naproxen (NAPROSYN) 500 MG tablet Take 1 tablet (500 mg total) by mouth 2 (two) times daily. 04/21/14   Renne Crigler, PA-C  oxyCODONE-acetaminophen (ROXICET) 5-325 MG per tablet Take 1 tablet by mouth every 6 (six) hours as needed for severe pain. 04/10/15   Karna Abed Orlene Och, NP   BP 127/89 mmHg  Pulse 93  Temp(Src) 97.3 F (36.3 C) (Oral)  Resp 16  SpO2 100% Physical Exam  Constitutional: He is oriented to person, place, and time. He appears well-developed and well-nourished.  HENT:  Head: Normocephalic.  Eyes: EOM are normal. Lids are everted and swept, no foreign bodies found. Right eye exhibits discharge. No foreign body present in the right eye. Right conjunctiva is injected.  Fundoscopic exam:      The right eye shows exudate.  Slit lamp exam:      The right eye shows corneal abrasion and fluorescein uptake. The right eye shows no corneal ulcer, no foreign body and no hyphema.  Neck: Neck supple.  Cardiovascular: Normal rate.   Pulmonary/Chest: Effort normal.  Musculoskeletal: Normal range of motion.  Neurological: He is alert and oriented to person, place, and time. No cranial nerve deficit.  Skin: Skin is  warm and dry.  Psychiatric: He has a normal mood and affect. His behavior is normal.  Nursing note and vitals reviewed.   ED Course  Procedures  DIAGNOSTIC STUDIES: Oxygen Saturation is 100% on RA, normal by my interpretation.    COORDINATION OF CARE: 3:25 PM Discussed treatment plan which includes to check for corneal abrasions or ulcerations with pt. Advised pt to follow up with referral given for ophthalmologist. Pt acknowledges and agrees to plan.    Dr. Gwendolyn Grant in do examine the patient with slit lamp.   4:46 PM Set up appointment to see Dr. Delaney Meigs tomorrow 04/11/15 at 8:am. Attempted to check IOP using tonopen but pt could not tolerate exam. Will prescribe ciprofloxacin opthalmic to use QID.     MDM  24 y.o. male with right eye pain and redness that started at work while he was wearing his contact lenses. Stable for d/c to follow up with Dr. Delaney Meigs 6/22. Discussed with the patient and all questioned fully answered.   Final diagnoses:  Corneal abrasion, right, initial encounter  Conjunctivitis of right eye   I personally performed the services described in this documentation, which was scribed in my presence. The recorded information has been reviewed and is accurate.   [address] [place], [place] 04/11/15 1253  Elwin Mocha, MD 04/11/15 1440

## 2015-04-10 NOTE — ED Notes (Signed)
NP Damian Leavell at bedside performing eye exam

## 2015-04-10 NOTE — ED Notes (Signed)
I gave the patient a cup of ice water per nurse Sheppard Penton.

## 2015-04-10 NOTE — ED Notes (Signed)
Pt c/o right eye irritation x 2 days. Pt wears contact. Was at work 2 days ago, right eye started feeling irritated. Took contacts out. Yesterday, woke with increased pain, redness and drainage.

## 2015-04-10 NOTE — Discharge Instructions (Signed)
We have scheduled you to see Dr. Delaney Meigs in the morning at 8:45 am  Do not take the narcotic if driving as it will make you sleepy.   Use the eye drops every 4 hours.

## 2015-04-10 NOTE — ED Notes (Signed)
MD at bedside. 

## 2015-04-10 NOTE — ED Notes (Signed)
Eye exam supplies placed at bedside.

## 2015-09-21 ENCOUNTER — Encounter (HOSPITAL_COMMUNITY): Payer: Self-pay

## 2015-09-21 ENCOUNTER — Emergency Department (HOSPITAL_COMMUNITY)
Admission: EM | Admit: 2015-09-21 | Discharge: 2015-09-21 | Disposition: A | Discharge status: Home / Self Care | Payer: PRIVATE HEALTH INSURANCE | Attending: Emergency Medicine | Admitting: Emergency Medicine

## 2015-09-21 DIAGNOSIS — E669 Obesity, unspecified: Secondary | ICD-10-CM | POA: Insufficient documentation

## 2015-09-21 DIAGNOSIS — I10 Essential (primary) hypertension: Secondary | ICD-10-CM | POA: Diagnosis not present

## 2015-09-21 DIAGNOSIS — Z791 Long term (current) use of non-steroidal anti-inflammatories (NSAID): Secondary | ICD-10-CM | POA: Insufficient documentation

## 2015-09-21 DIAGNOSIS — F17200 Nicotine dependence, unspecified, uncomplicated: Secondary | ICD-10-CM | POA: Insufficient documentation

## 2015-09-21 DIAGNOSIS — K0889 Other specified disorders of teeth and supporting structures: Secondary | ICD-10-CM | POA: Insufficient documentation

## 2015-09-21 DIAGNOSIS — R51 Headache: Secondary | ICD-10-CM | POA: Insufficient documentation

## 2015-09-21 DIAGNOSIS — Z79899 Other long term (current) drug therapy: Secondary | ICD-10-CM | POA: Diagnosis not present

## 2015-09-21 MED ORDER — IBUPROFEN 200 MG PO TABS
600.0000 mg | ORAL_TABLET | Freq: Once | ORAL | Status: AC
  Administered 2015-09-21: 600 mg via ORAL
  Filled 2015-09-21: qty 3

## 2015-09-21 MED ORDER — IBUPROFEN 600 MG PO TABS: 600.0000 mg | ORAL_TABLET | Freq: Three times a day (TID) | ORAL | Status: AC | PRN

## 2015-09-21 MED ORDER — TRAMADOL HCL 50 MG PO TABS
50.0000 mg | ORAL_TABLET | Freq: Once | ORAL | Status: AC
  Administered 2015-09-21: 50 mg via ORAL
  Filled 2015-09-21: qty 1

## 2015-09-21 MED ORDER — METOCLOPRAMIDE HCL 10 MG PO TABS
10.0000 mg | ORAL_TABLET | Freq: Once | ORAL | Status: AC
  Administered 2015-09-21: 10 mg via ORAL
  Filled 2015-09-21: qty 1

## 2015-09-21 NOTE — ED Provider Notes (Addendum)
CSN: [id number]     Arrival date & time 09/21/15  0449 History   First MD Initiated Contact with Patient 09/21/15 0510     Chief Complaint  Patient presents with  . Dental Pain  . Headache     (Consider location/radiation/quality/duration/timing/severity/associated sxs/prior Treatment) HPI   JARQUEZ MESTRE is a 24 y.o. male with no significant past medical history presenting today with dental pain. Patient states his left side of the pain is also causing him headaches. He has been told that he needs to have his teeth pulled but he has not had that done yet. Pain is throbbing and intermittent. He has no cold hot sensitivity. He has been taking Tylenol for his pain and has been working until today. There are no further complaints. He denies any swelling or drainage of purulence.  10 Systems reviewed and are negative for acute change except as noted in the HPI.      Past Medical History  Diagnosis Date  . Hypertension   . Obesity   . Narcolepsy    History reviewed. No pertinent past surgical history. History reviewed. No pertinent family history. Social History  Substance Use Topics  . Smoking status: Current Some Day Smoker  . Smokeless tobacco: None  . Alcohol Use: Yes     Comment: once or twice a month    Review of Systems    Allergies  Review of patient's allergies indicates no known allergies.  Home Medications   Prior to Admission medications   Medication Sig Start Date End Date Taking? Authorizing Provider  Multiple Vitamin (MULTIVITAMIN WITH MINERALS) TABS tablet Take 1 tablet by mouth daily.   Yes Historical Provider, MD  naproxen (NAPROSYN) 500 MG tablet Take 1 tablet (500 mg total) by mouth 2 (two) times daily. Patient not taking: Reported on 09/21/2015 04/21/14   Renne Crigler, PA-C  oxyCODONE-acetaminophen (ROXICET) 5-325 MG per tablet Take 1 tablet by mouth every 6 (six) hours as needed for severe pain. Patient not taking: Reported on 09/21/2015 04/10/15    Janne Napoleon, NP   BP 146/98 mmHg  Pulse 82  Temp(Src) 97.5 F (36.4 C) (Oral)  Resp 18  SpO2 100% Physical Exam  Constitutional: He is oriented to person, place, and time. Vital signs are normal. He appears well-developed and well-nourished.  Non-toxic appearance. He does not appear ill. No distress.  HENT:  Head: Normocephalic and atraumatic.  Nose: Nose normal.  Mouth/Throat: Oropharynx is clear and moist. No oropharyngeal exudate.  Poor dentition noted, worse at tooth #37 and 36.  Eyes: Conjunctivae and EOM are normal. Pupils are equal, round, and reactive to light. No scleral icterus.  Neck: Normal range of motion. Neck supple. No tracheal deviation, no edema, no erythema and normal range of motion present. No thyroid mass and no thyromegaly present.  Cardiovascular: Normal rate, regular rhythm, S1 normal, S2 normal, normal heart sounds, intact distal pulses and normal pulses.  Exam reveals no gallop and no friction rub.   No murmur heard. Pulmonary/Chest: Effort normal and breath sounds normal. No respiratory distress. He has no wheezes. He has no rhonchi. He has no rales.  Abdominal: Soft. Normal appearance and bowel sounds are normal. He exhibits no distension, no ascites and no mass. There is no hepatosplenomegaly. There is no tenderness. There is no rebound, no guarding and no CVA tenderness.  Musculoskeletal: Normal range of motion. He exhibits no edema or tenderness.  Lymphadenopathy:    He has no cervical adenopathy.  Neurological: He  is alert and oriented to person, place, and time. He has normal strength. No cranial nerve deficit or sensory deficit.  Skin: Skin is warm, dry and intact. No petechiae and no rash noted. He is not diaphoretic. No erythema. No pallor.  Psychiatric: He has a normal mood and affect. His behavior is normal. Judgment normal.  Nursing note and vitals reviewed.   ED Course  Procedures (including critical care time) Labs Review Labs Reviewed - No  data to display  Imaging Review No results found. I have personally reviewed and evaluated these images and lab results as part of my medical decision-making.   EKG Interpretation None      MDM   Final diagnoses:  None    Patient presents to the emergency department for evaluation of dental pain. No signs of swelling or infection seen. Patient was given regular, ibuprofen, and one tramadol tablet for pain control. Upon discharge patient was instructed to continue to continue ibuprofen see a dentist within 3 days. He demonstrated good understanding. Grandmother in the room also understands the plan. He appears well and in no acute distress, vital signs were within his normal limits and he is safe for discharg  Tomasita CrumbleAdeleke Johni Narine, MD 09/21/15 [phone]

## 2015-09-21 NOTE — Discharge Instructions (Signed)
Dental Pain Mr. Elijah Henderson, take ibuprofen 600 mg every 8 hours as needed for pain. See a dentist in 3 days for close follow-up. If any symptoms worsen come back to emergency department immediately. Thank you. Dental pain may be caused by many things, including:  Tooth decay (cavities or caries). Cavities cause the nerve of your tooth to be open to air and hot or cold temperatures. This can cause pain or discomfort.  Abscess or infection. A dental abscess is an area that is full of infected pus from a bacterial infection in the inner part of the tooth (pulp). It usually happens at the end of the tooth's root.  Injury.  An unknown reason (idiopathic). Your pain may be mild or severe. It may only happen when:  You are chewing.  You are exposed to hot or cold temperature.  You are eating or drinking sugary foods or beverages, such as:  Soda.  Candy. Your pain may also be there all of the time. HOME CARE Watch your dental pain for any changes. Do these things to lessen your discomfort:  Take medicines only as told by your dentist.  If your dentist tells you to take an antibiotic medicine, finish all of it even if you start to feel better.  Keep all follow-up visits as told by your dentist. This is important.  Do not apply heat to the outside of your face.  Rinse your mouth or gargle with salt water if told by your dentist. This helps with pain and swelling.  You can make salt water by adding  tsp of salt to 1 cup of warm water.  Apply ice to the painful area of your face:  Put ice in a plastic bag.  Place a towel between your skin and the bag.  Leave the ice on for 20 minutes, 2-3 times per day.  Avoid foods or drinks that cause you pain, such as:  Very hot or very cold foods or drinks.  Sweet or sugary foods or drinks. GET HELP IF:  Your pain is not helped with medicines.  Your symptoms are worse.  You have new symptoms. GET HELP RIGHT AWAY IF:  You cannot open  your mouth.  You are having trouble breathing or swallowing.  You have a fever.  Your face, neck, or jaw is puffy (swollen).   This information is not intended to replace advice given to you by your health care provider. Make sure you discuss any questions you have with your health care provider.   Document Released: 03/24/2008 Document Revised: 02/20/2015 Document Reviewed: 10/02/2014 Elsevier Interactive Patient Education [employer] Elsevier Inc.

## 2015-09-21 NOTE — ED Notes (Signed)
Pt verbalized understanding of d/c instructions and has no further questions. Pt stable and NAD. Pt d/c home with family member driving.  

## 2015-09-21 NOTE — ED Notes (Signed)
Pt complains of left lower dental pain and headaches x 2 weeks. Pt states that he was told by his dentist a year ago that a molar on his left lower needs to come out. Pt states that he doesn't think his headaches are coming from the tooth. The pain starts in his left anterior forehead and radiates down in the left jaw. Denies seeing spots and has no hx of migraines. A&Ox 4. Neuro intact.

## 2017-02-20 ENCOUNTER — Encounter (HOSPITAL_COMMUNITY): Payer: Self-pay | Admitting: *Deleted

## 2017-02-20 ENCOUNTER — Emergency Department (HOSPITAL_COMMUNITY)
Admission: EM | Admit: 2017-02-20 | Discharge: 2017-02-20 | Disposition: A | Discharge status: Home / Self Care | Payer: No Typology Code available for payment source | Attending: Emergency Medicine | Admitting: Emergency Medicine

## 2017-02-20 DIAGNOSIS — I10 Essential (primary) hypertension: Secondary | ICD-10-CM | POA: Insufficient documentation

## 2017-02-20 DIAGNOSIS — F17200 Nicotine dependence, unspecified, uncomplicated: Secondary | ICD-10-CM | POA: Insufficient documentation

## 2017-02-20 DIAGNOSIS — Z79899 Other long term (current) drug therapy: Secondary | ICD-10-CM | POA: Insufficient documentation

## 2017-02-20 DIAGNOSIS — H66002 Acute suppurative otitis media without spontaneous rupture of ear drum, left ear: Secondary | ICD-10-CM | POA: Insufficient documentation

## 2017-02-20 MED ORDER — AMOXICILLIN 500 MG PO CAPS: 500.0000 mg | ORAL_CAPSULE | Freq: Three times a day (TID) | ORAL | 0 refills | Status: DC

## 2017-02-20 NOTE — ED Provider Notes (Signed)
[facility]-EMERGENCY DEPT Provider Note   CSN: [id number] Arrival date & time: 02/20/17  0129     History   Chief Complaint Chief Complaint  Patient presents with  . Otalgia    HPI Elijah Henderson is a 26 y.o. male.  Patient presents emergency department with chief complaint left ear pain. He states that the symptoms have been gradually worsening for the past 3-4 days. He reports recent URI. He denies any fevers, chills, nausea, or vomiting. He has tried using swimmer's eardrops with no relief. He reports associated air fullness, and decreased hearing. There are no other modifying factors. There are no other associated symptoms.   The history is provided by the patient. No language interpreter was used.    Past Medical History:  Diagnosis Date  . Hypertension   . Narcolepsy   . Obesity     There are no active problems to display for this patient.   History reviewed. No pertinent surgical history.     Home Medications    Prior to Admission medications   Medication Sig Start Date End Date Taking? Authorizing Provider  ibuprofen (ADVIL,MOTRIN) 600 MG tablet Take 1 tablet (600 mg total) by mouth every 8 (eight) hours as needed for moderate pain. 09/21/15   Tomasita Crumble, MD  Multiple Vitamin (MULTIVITAMIN WITH MINERALS) TABS tablet Take 1 tablet by mouth daily.    Historical Provider, MD    Family History No family history on file.  Social History Social History  Substance Use Topics  . Smoking status: Current Some Day Smoker  . Smokeless tobacco: Not on file  . Alcohol use Yes     Comment: once or twice a month     Allergies   Patient has no known allergies.   Review of Systems Review of Systems  All other systems reviewed and are negative.    Physical Exam Updated Vital Signs BP (!) 142/102   Pulse 87   Temp 97.9 F (36.6 C) (Oral)   Resp 18   SpO2 97%   Physical Exam  Constitutional: He is oriented to person, place, and time. No distress.  HENT:   Head: Normocephalic and atraumatic.  Tympanic membrane is erythematous and congested, no evidence of otitis externa  Eyes: Conjunctivae and EOM are normal. Pupils are equal, round, and reactive to light.  Neck: No tracheal deviation present.  Cardiovascular: Normal rate.   Pulmonary/Chest: Effort normal. No respiratory distress.  Abdominal: Soft.  Musculoskeletal: Normal range of motion.  Neurological: He is alert and oriented to person, place, and time.  Skin: Skin is warm and dry. He is not diaphoretic.  Psychiatric: Judgment normal.  Nursing note and vitals reviewed.    ED Treatments / Results  Labs (all labs ordered are listed, but only abnormal results are displayed) Labs Reviewed - No data to display  EKG  EKG Interpretation None       Radiology No results found.  Procedures Procedures (including critical care time)  Medications Ordered in ED Medications - No data to display   Initial Impression / Assessment and Plan / ED Course  I have reviewed the triage vital signs and the nursing notes.  Pertinent labs & imaging results that were available during my care of the patient were reviewed by me and considered in my medical decision making (see chart for details).     Patient with otitis media. Will treat with amoxicillin. Recommend primary care follow-up. Vital signs stable. No mastoid tenderness. Patient is well-appearing.  Final  Clinical Impressions(s) / ED Diagnoses   Final diagnoses:  Acute suppurative otitis media of left ear without spontaneous rupture of tympanic membrane, recurrence not specified    New Prescriptions New Prescriptions   AMOXICILLIN (AMOXIL) 500 MG CAPSULE    Take 1 capsule (500 mg total) by mouth 3 (three) times daily.     Roxy Horsemanobert Mallisa [place], PA-C 02/20/17 0320    Geoffery Lyonsouglas Delo, MD 02/20/17 [phone]

## 2017-02-20 NOTE — ED Triage Notes (Signed)
Pt says for 3 days he has had congestion in his left ear, feels like "it is filled with air." Recently had the flu. No meds PTA. .Marland Kitchen

## 2018-03-14 ENCOUNTER — Ambulatory Visit (HOSPITAL_COMMUNITY)
Admission: EM | Admit: 2018-03-14 | Discharge: 2018-03-14 | Disposition: A | Discharge status: Home / Self Care | Payer: Self-pay | Attending: Family Medicine | Admitting: Family Medicine

## 2018-03-14 ENCOUNTER — Encounter (HOSPITAL_COMMUNITY): Payer: Self-pay | Admitting: Emergency Medicine

## 2018-03-14 DIAGNOSIS — K029 Dental caries, unspecified: Secondary | ICD-10-CM

## 2018-03-14 DIAGNOSIS — K0889 Other specified disorders of teeth and supporting structures: Secondary | ICD-10-CM

## 2018-03-14 MED ORDER — LIDOCAINE VISCOUS HCL 2 % MT SOLN: 15.0000 mL | OROMUCOSAL | 0 refills | Status: AC | PRN

## 2018-03-14 MED ORDER — AMOXICILLIN-POT CLAVULANATE 875-125 MG PO TABS: 1.0000 | ORAL_TABLET | Freq: Two times a day (BID) | ORAL | 0 refills | Status: AC

## 2018-03-14 MED ORDER — NAPROXEN 500 MG PO TABS: 500.0000 mg | ORAL_TABLET | Freq: Two times a day (BID) | ORAL | 0 refills | Status: AC

## 2018-03-14 NOTE — Discharge Instructions (Addendum)
Dental resource guide given.  Please make an appointment as soon as possible Prescribed augmentin.  Take as directed and to completion Prescribed naproxen.  Take as needed for pain Use viscous lidocaine solution. Swish 15 ml of solution around in mouth up to three times daily as needed. Return or go to the ER if you have any new or worsening symptoms

## 2018-03-14 NOTE — ED Triage Notes (Signed)
Pt c/o tooth pain for "years".

## 2018-03-14 NOTE — ED Provider Notes (Signed)
[facility]   [id number] 03/14/18 Arrival Time: 1933  SUBJECTIVE:  Elijah Henderson is a 27 y.o. male who reports abrupt onset of left lower dental pain described as sharp and throbbing. Present for 2 days. Denies a precipitating event.  Afebrile. Tolerating PO intake but reports pain with chewing. Normal swallowing. He does see a dentist regularly. No neck swelling or pain. OTC analgesics without relief.  ROS: As per HPI.  Past Medical History:  Diagnosis Date  . Hypertension   . Narcolepsy   . Obesity    History reviewed. No pertinent surgical history. No Known Allergies No current facility-administered medications on file prior to encounter.    Current Outpatient Medications on File Prior to Encounter  Medication Sig Dispense Refill  . amoxicillin (AMOXIL) 500 MG capsule Take 1 capsule (500 mg total) by mouth 3 (three) times daily. (Patient not taking: Reported on 03/14/2018) 30 capsule 0  . ibuprofen (ADVIL,MOTRIN) 600 MG tablet Take 1 tablet (600 mg total) by mouth every 8 (eight) hours as needed for moderate pain. (Patient not taking: Reported on 03/14/2018) 15 tablet 0  . Multiple Vitamin (MULTIVITAMIN WITH MINERALS) TABS tablet Take 1 tablet by mouth daily.     Social History   Socioeconomic History  . Marital status: Single    Spouse name: Not on file  . Number of children: Not on file  . Years of education: Not on file  . Highest education level: Not on file  Occupational History  . Not on file  Social Needs  . Financial resource strain: Not on file  . Food insecurity:    Worry: Not on file    Inability: Not on file  . Transportation needs:    Medical: Not on file    Non-medical: Not on file  Tobacco Use  . Smoking status: Current Some Day Smoker  Substance and Sexual Activity  . Alcohol use: Yes    Comment: once or twice a month  . Drug use: No  . Sexual activity: Not on file  Lifestyle  . Physical activity:    Days per week: Not on file   Minutes per session: Not on file  . Stress: Not on file  Relationships  . Social connections:    Talks on phone: Not on file    Gets together: Not on file    Attends religious service: Not on file    Active member of club or organization: Not on file    Attends meetings of clubs or organizations: Not on file    Relationship status: Not on file  . Intimate partner violence:    Fear of current or ex partner: Not on file    Emotionally abused: Not on file    Physically abused: Not on file    Forced sexual activity: Not on file  Other Topics Concern  . Not on file  Social History Narrative  . Not on file   No family history on file.  OBJECTIVE:  Vitals:   03/14/18 1949  BP: (!) 148/88  Pulse: 77  Resp: 16  Temp: 98.3 F (36.8 C)  SpO2: 98%    General appearance: alert; no distress HENT: normocephalic; atraumatic; poor dentition: multiple carries; inflamed over left lower gums without areas of fluctuance Neck: supple without LAD Lungs: normal respirations Skin: warm and dry Psychological: alert and cooperative; normal mood and affect  ASSESSMENT & PLAN:  1. Pain, dental   2. Dental caries     Meds ordered this  encounter  Medications  . amoxicillin-clavulanate (AUGMENTIN) 875-125 MG tablet    Sig: Take 1 tablet by mouth every 12 (twelve) hours for 10 days.    Dispense:  20 tablet    Refill:  0    Order Specific Question:   Supervising Provider    Answer:   Isa Rankin [phone]  . naproxen (NAPROSYN) 500 MG tablet    Sig: Take 1 tablet (500 mg total) by mouth 2 (two) times daily.    Dispense:  30 tablet    Refill:  0    Order Specific Question:   Supervising Provider    Answer:   Isa Rankin [phone]  . lidocaine (XYLOCAINE) 2 % solution    Sig: Use as directed 15 mLs in the mouth or throat as needed for mouth pain.    Dispense:  100 mL    Refill:  0    Order Specific Question:   Supervising Provider    Answer:   Isa Rankin  [[id number]]   Dental resource guide given.  Please make an appointment as soon as possible Prescribed augmentin.  Take as directed and to completion Prescribed naproxen.  Take as needed for pain Use viscous lidocaine solution. Swish 15 ml of solution around in mouth up to three times daily as needed. Return or go to the ER if you have any new or worsening symptoms  Reviewed expectations re: course of current medical issues. Questions answered. Outlined signs and symptoms indicating need for more acute intervention. Patient verbalized understanding. After Visit Summary given.   Rennis Harding, PA-C 03/14/18 2052

## 2020-05-28 ENCOUNTER — Other Ambulatory Visit: Payer: Self-pay

## 2020-05-28 ENCOUNTER — Emergency Department (HOSPITAL_COMMUNITY)
Admission: EM | Admit: 2020-05-28 | Discharge: 2020-05-28 | Disposition: A | Discharge status: Home / Self Care | Payer: Worker's Compensation | Attending: Emergency Medicine | Admitting: Emergency Medicine

## 2020-05-28 ENCOUNTER — Encounter (HOSPITAL_COMMUNITY): Payer: Self-pay | Admitting: Emergency Medicine

## 2020-05-28 DIAGNOSIS — Y999 Unspecified external cause status: Secondary | ICD-10-CM | POA: Insufficient documentation

## 2020-05-28 DIAGNOSIS — Y939 Activity, unspecified: Secondary | ICD-10-CM | POA: Diagnosis not present

## 2020-05-28 DIAGNOSIS — Y929 Unspecified place or not applicable: Secondary | ICD-10-CM | POA: Insufficient documentation

## 2020-05-28 DIAGNOSIS — Z5321 Procedure and treatment not carried out due to patient leaving prior to being seen by health care provider: Secondary | ICD-10-CM | POA: Insufficient documentation

## 2020-05-28 DIAGNOSIS — X58XXXA Exposure to other specified factors, initial encounter: Secondary | ICD-10-CM | POA: Diagnosis not present

## 2020-05-28 DIAGNOSIS — S6990XA Unspecified injury of unspecified wrist, hand and finger(s), initial encounter: Secondary | ICD-10-CM | POA: Diagnosis present

## 2020-05-28 NOTE — ED Triage Notes (Signed)
Pt sent from [facility] to see Dr. Janee Morn for possible surgery.

## 2020-05-28 NOTE — ED Notes (Signed)
Pt was under the impression that a specialist was paged to the ED for him to be seen in the ED. Pt left due to misunderstanding.

## 2023-05-22 ENCOUNTER — Other Ambulatory Visit: Payer: Self-pay

## 2023-05-22 ENCOUNTER — Encounter (HOSPITAL_COMMUNITY): Payer: Self-pay

## 2023-05-22 ENCOUNTER — Emergency Department (HOSPITAL_COMMUNITY)
Admission: EM | Admit: 2023-05-22 | Discharge: 2023-05-22 | Disposition: A | Discharge status: Home / Self Care | Payer: No Typology Code available for payment source | Attending: Emergency Medicine | Admitting: Emergency Medicine

## 2023-05-22 DIAGNOSIS — L02413 Cutaneous abscess of right upper limb: Secondary | ICD-10-CM | POA: Insufficient documentation

## 2023-05-22 LAB — CBC WITH DIFFERENTIAL/PLATELET
Abs Immature Granulocytes: 0.03 10*3/uL (ref 0.00–0.07)
Basophils Absolute: 0 10*3/uL (ref 0.0–0.1)
Basophils Relative: 0 %
Eosinophils Absolute: 0.2 10*3/uL (ref 0.0–0.5)
Eosinophils Relative: 2 %
HCT: 41 % (ref 39.0–52.0)
Hemoglobin: 13.4 g/dL (ref 13.0–17.0)
Immature Granulocytes: 0 %
Lymphocytes Relative: 17 %
Lymphs Abs: 1.8 10*3/uL (ref 0.7–4.0)
MCH: 30.3 pg (ref 26.0–34.0)
MCHC: 32.7 g/dL (ref 30.0–36.0)
MCV: 92.8 fL (ref 80.0–100.0)
Monocytes Absolute: 0.5 10*3/uL (ref 0.1–1.0)
Monocytes Relative: 5 %
Neutro Abs: 8.1 10*3/uL — ABNORMAL HIGH (ref 1.7–7.7)
Neutrophils Relative %: 76 %
Platelets: 240 10*3/uL (ref 150–400)
RBC: 4.42 MIL/uL (ref 4.22–5.81)
RDW: 12.8 % (ref 11.5–15.5)
WBC: 10.6 10*3/uL — ABNORMAL HIGH (ref 4.0–10.5)
nRBC: 0 % (ref 0.0–0.2)

## 2023-05-22 LAB — COMPREHENSIVE METABOLIC PANEL
ALT: 29 U/L (ref 0–44)
AST: 32 U/L (ref 15–41)
Albumin: 3.6 g/dL (ref 3.5–5.0)
Alkaline Phosphatase: 52 U/L (ref 38–126)
Anion gap: 9 (ref 5–15)
BUN: 10 mg/dL (ref 6–20)
CO2: 24 mmol/L (ref 22–32)
Calcium: 8.7 mg/dL — ABNORMAL LOW (ref 8.9–10.3)
Chloride: 106 mmol/L (ref 98–111)
Creatinine, Ser: 0.86 mg/dL (ref 0.61–1.24)
GFR, Estimated: >60 mL/min (ref >60)
Glucose, Bld: 116 mg/dL — ABNORMAL HIGH (ref 70–99)
Potassium: 3 mmol/L — ABNORMAL LOW (ref 3.5–5.1)
Sodium: 139 mmol/L (ref 135–145)
Total Bilirubin: 0.8 mg/dL (ref 0.3–1.2)
Total Protein: 7.1 g/dL (ref 6.5–8.1)

## 2023-05-22 LAB — I-STAT CG4 LACTIC ACID, ED: Lactic Acid, Venous: 1.5 mmol/L (ref 0.5–1.9)

## 2023-05-22 MED ORDER — DOXYCYCLINE HYCLATE 100 MG PO CAPS: 100.0000 mg | ORAL_CAPSULE | Freq: Two times a day (BID) | ORAL | 0 refills | Status: AC

## 2023-05-22 MED ORDER — POTASSIUM CHLORIDE CRYS ER 20 MEQ PO TBCR: 40.0000 meq | EXTENDED_RELEASE_TABLET | Freq: Once | ORAL | Status: DC

## 2023-05-22 MED ORDER — POTASSIUM CHLORIDE CRYS ER 20 MEQ PO TBCR: 20.0000 meq | EXTENDED_RELEASE_TABLET | Freq: Every day | ORAL | 0 refills | Status: AC

## 2023-05-22 MED ORDER — LIDOCAINE-EPINEPHRINE (PF) 2 %-1:200000 IJ SOLN
10.0000 mL | Freq: Once | INTRAMUSCULAR | Status: AC
  Administered 2023-05-22: 10 mL via INTRADERMAL
  Filled 2023-05-22: qty 20

## 2023-05-22 NOTE — ED Triage Notes (Signed)
Pt noticed a small scab on his right arm Monday. Since then swelling and pain has increased around the site. Told by his employer that he needs to have it checked out. Pt endorses a fever last night, but says it broke this morning.

## 2023-05-22 NOTE — Discharge Instructions (Addendum)
You have been evaluated for your symptoms.  You have an abscess to your right forearm that was incised and drained.  Please apply warm moist compress to affected area several times daily to aid with healing.  You may remove the packing in 2 days.  Continue taking antibiotic as prescribed for the full duration.  If your condition worsen return to the ER for reassessment.  Your potassium level is low today.  It is 3.0.  Normal ranges 3.5-5.1.  Please take potassium supplementation and follow-up with your doctor for recheck.

## 2023-05-22 NOTE — ED Provider Notes (Signed)
[place] EMERGENCY DEPARTMENT AT [facility] Provider Note   CSN: [id number] Arrival date & time: 05/22/23  1258     History  Chief Complaint  Patient presents with   Wound Infection    Elijah Henderson is a 32 y.o. male.  The history is provided by the patient. No language interpreter was used.     32 year old male significant history of hypertension, obesity, presenting with concerns of a wound.  Patient report progressive swelling and pain noted to his right forearm ongoing for the past 4 days.  He is unsure what caused the swelling.  He described pain as a throbbing sensation worse when he palpated.  He noticed increasing swelling.  He does not endorse any fever or chills no numbness no weakness.  He is up-to-date with tetanus.  He is right-hand dominant.  No specific treatment tried.  He is unsure of anything that may have caused it.  Home Medications Prior to Admission medications   Medication Sig Start Date End Date Taking? Authorizing Provider  amoxicillin (AMOXIL) 500 MG capsule Take 1 capsule (500 mg total) by mouth 3 (three) times daily. Patient not taking: Reported on 03/14/2018 02/20/17   Roxy Horseman, PA-C  ibuprofen (ADVIL,MOTRIN) 600 MG tablet Take 1 tablet (600 mg total) by mouth every 8 (eight) hours as needed for moderate pain. Patient not taking: Reported on 03/14/2018 09/21/15   Tomasita Crumble, MD  lidocaine (XYLOCAINE) 2 % solution Use as directed 15 mLs in the mouth or throat as needed for mouth pain. 03/14/18   Wurst, [place], PA-C  Multiple Vitamin (MULTIVITAMIN WITH MINERALS) TABS tablet Take 1 tablet by mouth daily.    [provider]  naproxen (NAPROSYN) 500 MG tablet Take 1 tablet (500 mg total) by mouth 2 (two) times daily. 03/14/18   Wurst, [place], PA-C      Allergies    Patient has no known allergies.    Review of Systems   Review of Systems  All other systems reviewed and are negative.   Physical Exam Updated Vital  Signs BP (!) 149/98 (BP Location: Left Arm)   Pulse 96   Temp 98.2 F (36.8 C) (Oral)   Resp 18   Ht 5\' 9"  (1.753 m)   Wt 104 kg   SpO2 100%   BMI 33.86 kg/m  Physical Exam Vitals and nursing note reviewed.  Constitutional:      General: He is not in acute distress.    Appearance: He is well-developed.  HENT:     Head: Atraumatic.  Eyes:     Conjunctiva/sclera: Conjunctivae normal.  Musculoskeletal:     Cervical back: Neck supple.  Skin:    Findings: No rash.     Comments: Right forearm: There is an area of induration and fluctuant noted to the mid dorsum of the forearm tenderness to palpation with minimal surrounding skin erythema.  Area measuring approximately 4 cm in diameter.  Neurological:     Mental Status: He is alert.     ED Results / Procedures / Treatments   Labs (all labs ordered are listed, but only abnormal results are displayed) Labs Reviewed  COMPREHENSIVE METABOLIC PANEL - Abnormal; Notable for the following components:      Result Value   Potassium 3.0 (*)    Glucose, Bld 116 (*)    Calcium 8.7 (*)    All other components within normal limits  CBC WITH DIFFERENTIAL/PLATELET - Abnormal; Notable for the following components:   WBC 10.6 (*)  Neutro Abs 8.1 (*)    All other components within normal limits  I-STAT CG4 LACTIC ACID, ED  I-STAT CG4 LACTIC ACID, ED  I-STAT CG4 LACTIC ACID, ED  I-STAT CG4 LACTIC ACID, ED    EKG None  Radiology No results found.  Procedures .Marland KitchenIncision and Drainage  Date/Time: 05/22/2023 3:38 PM  Performed by: Fayrene Helper, PA-C Authorized by: Fayrene Helper, PA-C   Consent:    Consent obtained:  Verbal   Consent given by:  Patient   Risks discussed:  Bleeding, incomplete drainage, pain and damage to other organs   Alternatives discussed:  No treatment Universal protocol:    Procedure explained and questions answered to patient or proxy's satisfaction: yes     Relevant documents present and verified: yes      Test results available : yes     Imaging studies available: yes     Required blood products, implants, devices, and special equipment available: yes     Site/side marked: yes     Immediately prior to procedure, a time out was called: yes     Patient identity confirmed:  Verbally with patient Location:    Type:  Abscess   Size:  4   Location:  Upper extremity   Upper extremity location: R forearm, dorsum. Pre-procedure details:    Skin preparation:  Betadine Sedation:    Sedation type:  None Anesthesia:    Anesthesia method:  Local infiltration   Local anesthetic:  Lidocaine 1% WITH epi Procedure type:    Complexity:  Complex Procedure details:    Incision types:  Single straight   Incision depth:  Subcutaneous   Wound management:  Probed and deloculated, irrigated with saline and extensive cleaning   Drainage:  Purulent   Drainage amount:  Moderate   Packing materials:  1/2 in iodoform gauze Post-procedure details:    Procedure completion:  Tolerated well, no immediate complications     Medications Ordered in ED Medications  lidocaine-EPINEPHrine (XYLOCAINE W/EPI) 2 %-1:200000 (PF) injection 10 mL (has no administration in time range)  potassium chloride SA (KLOR-CON M) CR tablet 40 mEq (has no administration in time range)    ED Course/ Medical Decision Making/ A&P                                 Medical Decision Making Amount and/or Complexity of Data Reviewed Labs: ordered.   BP (!) 149/98 (BP Location: Left Arm)   Pulse 96   Temp 98.2 F (36.8 C) (Oral)   Resp 18   Ht 5\' 9"  (1.753 m)   Wt 104 kg   SpO2 100%   BMI 33.86 kg/m   8:31 PM  32 year old male significant history of hypertension, obesity, presenting with concerns of a wound.  Patient report progressive swelling and pain noted to his right forearm ongoing for the past 4 days.  He is unsure what caused the swelling.  He described pain as a throbbing sensation worse when he palpated.  He noticed  increasing swelling.  He does not endorse any fever or chills no numbness no weakness.  He is up-to-date with tetanus.  He is right-hand dominant.  No specific treatment tried.  He is unsure of anything that may have caused it.  On exam patient has a subcutaneous abscess involving his right forearm on the dorsal aspect.  No lymphangitis noted.  He does have some reactive lymph nodes to his right  axillary fold.  Was able to successfully incise and drain this abscess.  Patient tolerates well.  Will discharge home with doxycycline, warm compress, and appropriate wound care.  Packing to be removed in 2 days.  Return precaution given.  Labs obtained  and remarkable for potassium of 3.0.  Will give supplementation.  Ultrasound of forearm considered however low yield as patient has an obvious abscess visible.        Final Clinical Impression(s) / ED Diagnoses Final diagnoses:  Abscess of right forearm    Rx / DC Orders ED Discharge Orders          Ordered    doxycycline (VIBRAMYCIN) 100 MG capsule  2 times daily        05/22/23 1553    potassium chloride SA (KLOR-CON M) 20 MEQ tablet  Daily        05/22/23 1553              Fayrene Helper, PA-C 05/22/23 1555    Charlynne Pander, MD 05/22/23 2232

## 2024-04-21 ENCOUNTER — Emergency Department (HOSPITAL_COMMUNITY)

## 2024-04-21 ENCOUNTER — Emergency Department (HOSPITAL_COMMUNITY): Admission: EM | Admit: 2024-04-21 | Discharge: 2024-04-21 | Disposition: A | Discharge status: Home / Self Care

## 2024-04-21 ENCOUNTER — Other Ambulatory Visit: Payer: Self-pay

## 2024-04-21 ENCOUNTER — Encounter (HOSPITAL_COMMUNITY): Payer: Self-pay | Admitting: *Deleted

## 2024-04-21 DIAGNOSIS — J069 Acute upper respiratory infection, unspecified: Secondary | ICD-10-CM | POA: Insufficient documentation

## 2024-04-21 DIAGNOSIS — R059 Cough, unspecified: Secondary | ICD-10-CM | POA: Diagnosis present

## 2024-04-21 DIAGNOSIS — R509 Fever, unspecified: Secondary | ICD-10-CM

## 2024-04-21 DIAGNOSIS — R110 Nausea: Secondary | ICD-10-CM

## 2024-04-21 LAB — CBC WITH DIFFERENTIAL/PLATELET
Abs Immature Granulocytes: 0.12 10*3/uL — ABNORMAL HIGH (ref 0.00–0.07)
Basophils Absolute: 0.1 10*3/uL (ref 0.0–0.1)
Basophils Relative: 0 %
Eosinophils Absolute: 0 10*3/uL (ref 0.0–0.5)
Eosinophils Relative: 0 %
HCT: 36.8 % — ABNORMAL LOW (ref 39.0–52.0)
Hemoglobin: 12.3 g/dL — ABNORMAL LOW (ref 13.0–17.0)
Immature Granulocytes: 1 %
Lymphocytes Relative: 8 %
Lymphs Abs: 1.4 10*3/uL (ref 0.7–4.0)
MCH: 28.9 pg (ref 26.0–34.0)
MCHC: 33.4 g/dL (ref 30.0–36.0)
MCV: 86.6 fL (ref 80.0–100.0)
Monocytes Absolute: 1.3 10*3/uL — ABNORMAL HIGH (ref 0.1–1.0)
Monocytes Relative: 8 %
Neutro Abs: 14.5 10*3/uL — ABNORMAL HIGH (ref 1.7–7.7)
Neutrophils Relative %: 83 %
Platelets: 316 10*3/uL (ref 150–400)
RBC: 4.25 MIL/uL (ref 4.22–5.81)
RDW: 13.1 % (ref 11.5–15.5)
WBC: 17.4 10*3/uL — ABNORMAL HIGH (ref 4.0–10.5)
nRBC: 0 % (ref 0.0–0.2)

## 2024-04-21 LAB — COMPREHENSIVE METABOLIC PANEL WITH GFR
ALT: 19 U/L (ref 0–44)
AST: 21 U/L (ref 15–41)
Albumin: 3.4 g/dL — ABNORMAL LOW (ref 3.5–5.0)
Alkaline Phosphatase: 55 U/L (ref 38–126)
Anion gap: 12 (ref 5–15)
BUN: 5 mg/dL — ABNORMAL LOW (ref 6–20)
CO2: 22 mmol/L (ref 22–32)
Calcium: 8.9 mg/dL (ref 8.9–10.3)
Chloride: 96 mmol/L — ABNORMAL LOW (ref 98–111)
Creatinine, Ser: 1.16 mg/dL (ref 0.61–1.24)
GFR, Estimated: >60 mL/min (ref >60)
Glucose, Bld: 102 mg/dL — ABNORMAL HIGH (ref 70–99)
Potassium: 3.9 mmol/L (ref 3.5–5.1)
Sodium: 130 mmol/L — ABNORMAL LOW (ref 135–145)
Total Bilirubin: 1.3 mg/dL — ABNORMAL HIGH (ref 0.0–1.2)
Total Protein: 7.7 g/dL (ref 6.5–8.1)

## 2024-04-21 LAB — RESP PANEL BY RT-PCR (RSV, FLU A&B, COVID)  RVPGX2
Influenza A by PCR: NEGATIVE
Influenza B by PCR: NEGATIVE
Resp Syncytial Virus by PCR: NEGATIVE
SARS Coronavirus 2 by RT PCR: NEGATIVE

## 2024-04-21 LAB — I-STAT CG4 LACTIC ACID, ED: Lactic Acid, Venous: 0.9 mmol/L (ref 0.5–1.9)

## 2024-04-21 MED ORDER — AZITHROMYCIN 250 MG PO TABS
500.0000 mg | ORAL_TABLET | Freq: Once | ORAL | Status: AC
  Administered 2024-04-21: 500 mg via ORAL
  Filled 2024-04-21: qty 2

## 2024-04-21 MED ORDER — AZITHROMYCIN 250 MG PO TABS
500.0000 mg | ORAL_TABLET | Freq: Every day | ORAL | 0 refills | Status: AC
Start: 2024-04-21 — End: 2024-04-23

## 2024-04-21 MED ORDER — PSEUDOEPH-BROMPHEN-DM 30-2-10 MG/5ML PO SYRP: 5.0000 mL | ORAL_SOLUTION | Freq: Four times a day (QID) | ORAL | 0 refills | Status: AC | PRN

## 2024-04-21 MED ORDER — HYDROCODONE BIT-HOMATROP MBR 5-1.5 MG/5ML PO SOLN
5.0000 mL | Freq: Once | ORAL | Status: AC
  Administered 2024-04-21: 5 mL via ORAL
  Filled 2024-04-21: qty 5

## 2024-04-21 MED ORDER — ONDANSETRON HCL 4 MG PO TABS: 4.0000 mg | ORAL_TABLET | Freq: Four times a day (QID) | ORAL | 0 refills | Status: AC

## 2024-04-21 MED ORDER — IBUPROFEN 800 MG PO TABS
800.0000 mg | ORAL_TABLET | Freq: Once | ORAL | Status: AC
  Administered 2024-04-21: 800 mg via ORAL
  Filled 2024-04-21: qty 1

## 2024-04-21 MED ORDER — ACETAMINOPHEN 500 MG PO TABS
1000.0000 mg | ORAL_TABLET | Freq: Once | ORAL | Status: AC
  Administered 2024-04-21: 1000 mg via ORAL
  Filled 2024-04-21: qty 2

## 2024-04-21 MED ORDER — ONDANSETRON 4 MG PO TBDP
4.0000 mg | ORAL_TABLET | Freq: Once | ORAL | Status: AC
  Administered 2024-04-21: 4 mg via ORAL
  Filled 2024-04-21: qty 1

## 2024-04-21 NOTE — ED Provider Notes (Signed)
 [place] EMERGENCY DEPARTMENT AT [facility] Provider Note   CSN: [id number] Arrival date & time: 04/21/24  1531     Patient presents with: Cough, Fever, and Chest Pain   Elijah Henderson is a 33 y.o. male.   33 year old male presents for evaluation of fevers and cough.  Patient states has been going on for the last couple days.  States he has been nausea at times but did not have vomiting until he took Tylenol  while he was in the waiting room today.  States he was able to eat afterward.  Admits to difficulty sleeping secondary to cough.  Has not been taking any over-the-counter medications for his symptoms.  He denies any other symptoms or concerns at this time   Cough Associated symptoms: chest pain and fever   Associated symptoms: no chills, no ear pain, no rash, no shortness of breath and no sore throat   Fever Associated symptoms: chest pain, cough and nausea   Associated symptoms: no chills, no dysuria, no ear pain, no rash, no sore throat and no vomiting   Chest Pain Associated symptoms: cough, fever and nausea   Associated symptoms: no abdominal pain, no back pain, no palpitations, no shortness of breath and no vomiting        Prior to Admission medications   Medication Sig Start Date End Date Taking? Authorizing Provider  azithromycin (ZITHROMAX) 250 MG tablet Take 2 tablets (500 mg total) by mouth daily for 2 days. Take first 2 tablets together, then 1 every day until finished. 04/21/24 04/23/24 Yes Ariah Mower L, DO  brompheniramine-pseudoephedrine-DM 30-2-10 MG/5ML syrup Take 5 mLs by mouth 4 (four) times daily as needed for up to 6 days. 04/21/24 04/27/24 Yes Kirtis Challis L, DO  ondansetron (ZOFRAN) 4 MG tablet Take 1 tablet (4 mg total) by mouth every 6 (six) hours. 04/21/24  Yes Laverne Klugh L, DO  doxycycline  (VIBRAMYCIN ) 100 MG capsule Take 1 capsule (100 mg total) by mouth 2 (two) times daily. 05/22/23   Nivia Colon, PA-C  ibuprofen  (ADVIL ,MOTRIN ) 600  MG tablet Take 1 tablet (600 mg total) by mouth every 8 (eight) hours as needed for moderate pain. Patient not taking: Reported on 03/14/2018 09/21/15   Carlota Day, MD  lidocaine  (XYLOCAINE ) 2 % solution Use as directed 15 mLs in the mouth or throat as needed for mouth pain. 03/14/18   Wurst, [place], PA-C  Multiple Vitamin (MULTIVITAMIN WITH MINERALS) TABS tablet Take 1 tablet by mouth daily.    [provider]  naproxen  (NAPROSYN ) 500 MG tablet Take 1 tablet (500 mg total) by mouth 2 (two) times daily. 03/14/18   Wurst, [place], PA-C  potassium chloride  SA (KLOR-CON  M) 20 MEQ tablet Take 1 tablet (20 mEq total) by mouth daily. 05/22/23   Nivia Colon, PA-C    Allergies: Patient has no known allergies.    Review of Systems  Constitutional:  Positive for fever. Negative for chills.  HENT:  Negative for ear pain and sore throat.   Eyes:  Negative for pain and visual disturbance.  Respiratory:  Positive for cough. Negative for shortness of breath.   Cardiovascular:  Positive for chest pain. Negative for palpitations.  Gastrointestinal:  Positive for nausea. Negative for abdominal pain and vomiting.  Genitourinary:  Negative for dysuria and hematuria.  Musculoskeletal:  Negative for arthralgias and back pain.  Skin:  Negative for color change and rash.  Neurological:  Negative for seizures and syncope.  All other systems reviewed and are  negative.   Updated Vital Signs BP (!) 101/90 (BP Location: Right Arm)   Pulse 97   Temp 98.3 F (36.8 C)   Resp 16   SpO2 97%   Physical Exam Vitals and nursing note reviewed.  Constitutional:      General: He is not in acute distress.    Appearance: He is well-developed. He is not ill-appearing.  HENT:     Head: Normocephalic and atraumatic.  Eyes:     Conjunctiva/sclera: Conjunctivae normal.  Cardiovascular:     Rate and Rhythm: Normal rate and regular rhythm.     Heart sounds: Normal heart sounds. No murmur heard. Pulmonary:      Effort: Pulmonary effort is normal. No tachypnea, accessory muscle usage or respiratory distress.     Breath sounds: Normal breath sounds. No stridor.  Abdominal:     Palpations: Abdomen is soft.     Tenderness: There is no abdominal tenderness.  Musculoskeletal:        General: No swelling. Normal range of motion.     Cervical back: Neck supple.  Skin:    General: Skin is warm and dry.     Capillary Refill: Capillary refill takes less than 2 seconds.  Neurological:     General: No focal deficit present.     Mental Status: He is alert.  Psychiatric:        Mood and Affect: Mood normal.     (all labs ordered are listed, but only abnormal results are displayed) Labs Reviewed  CBC WITH DIFFERENTIAL/PLATELET - Abnormal; Notable for the following components:      Result Value   WBC 17.4 (*)    Hemoglobin 12.3 (*)    HCT 36.8 (*)    Neutro Abs 14.5 (*)    Monocytes Absolute 1.3 (*)    Abs Immature Granulocytes 0.12 (*)    All other components within normal limits  COMPREHENSIVE METABOLIC PANEL WITH GFR - Abnormal; Notable for the following components:   Sodium 130 (*)    Chloride 96 (*)    Glucose, Bld 102 (*)    BUN 5 (*)    Albumin 3.4 (*)    Total Bilirubin 1.3 (*)    All other components within normal limits  RESP PANEL BY RT-PCR (RSV, FLU A&B, COVID)  RVPGX2  CULTURE, BLOOD (ROUTINE X 2)  CULTURE, BLOOD (ROUTINE X 2)  I-STAT CG4 LACTIC ACID, ED  I-STAT CG4 LACTIC ACID, ED    EKG: EKG Interpretation Date/Time:  Thursday April 21 2024 16:41:37 EDT Ventricular Rate:  110 PR Interval:  150 QRS Duration:  76 QT Interval:  312 QTC Calculation: 422 R Axis:   7  Text Interpretation: Sinus tachycardia Otherwise normal ECG No previous ECGs available Confirmed by Gennaro Bouchard ([id number]) on 04/21/2024 9:47:16 PM  Radiology: [facility] Chest 2 View Result Date: 04/21/2024 CLINICAL DATA:  Cough with chest congestion. EXAM: CHEST - 2 VIEW COMPARISON:  None Available. FINDINGS: The  heart size and mediastinal contours are within normal limits. Both lungs are clear. There is mild dextroscoliosis of the mid to lower thoracic spine. The visualized skeletal structures are otherwise unremarkable. IMPRESSION: No active cardiopulmonary disease. Electronically Signed   By: Suzen Dials M.D.   On: 04/21/2024 18:36     Procedures   Medications Ordered in the ED  HYDROcodone  bit-homatropine (HYCODAN) 5-1.5 MG/5ML syrup 5 mL (has no administration in time range)  ondansetron (ZOFRAN-ODT) disintegrating tablet 4 mg (has no administration in time range)  azithromycin (ZITHROMAX)  tablet 500 mg (has no administration in time range)  acetaminophen  (TYLENOL ) tablet 1,000 mg (1,000 mg Oral Given 04/21/24 1646)  ibuprofen  (ADVIL ) tablet 800 mg (800 mg Oral Given 04/21/24 1824)                                    Medical Decision Making Medical Decision Making Nursing notes are reviewed. Differential diagnosis for this patient would include but not limited to: Viral URI, walking pneumonia, pneumonia, ulcers, other  Cardiac monitor interpretation: Sinus rhythm, no ectopy  Emergency Department Course:  Vital signs and pulse oximetry are reviewed, evaluated by myself and found to be within normal limits prior to final disposition. Findings of laboratory testing and medical imaging are discussed with patient and family that is available. Patient agrees with the medical care plan as follows:  Patient arrived to the ER today at the waiting room with fever and tachycardia.  Meets SIRS criteria but this improved after fever was controlled.  He was given Tylenol  and Motrin  and is feeling much improved.  Remainder of workup fairly unremarkable.  His vitals are stable now.  Oxygen has been 99% on room air.  I rec try Tylenol  Motrin  every 3 hours as needed for pain and fever.  Will give a prescription for cough medication and azithromycin for 3 days as he does have a leukocytosis and may have an  atypical pneumonia. Was given zofran was nausea but has been able to tolerate PO.  Lactate was within normal limits.  Likely viral etiology for his symptoms.  Advise close follow-up with primary care and otherwise return to the ER for any worsening symptoms.  He feels comfortable with discharge.  Problems Addressed: Febrile illness: acute illness or injury Nausea: acute illness or injury Upper respiratory tract infection, unspecified type: acute illness or injury  Amount and/or Complexity of Data Reviewed External Data Reviewed: notes.    Details: Outpatient records reviewed and patient was seen by telehealth earlier today and advised to go to the ER Labs: ordered. Decision-making details documented in ED Course.    Details: Ordered and interpreted by me and patient has a leukocytosis but no other acute lab abnormality Radiology: ordered and independent interpretation performed. Decision-making details documented in ED Course.    Details: Ordered and interpreted independently of radiology and chest x-ray shows no evidence of infiltrate or acute cardiopulmonary process ECG/medicine tests: ordered and independent interpretation performed. Decision-making details documented in ED Course.    Details: Ordered and interpreted in the absence of cardiology and shows sinus tachycardia but no other acute abnormality  Risk OTC drugs. Prescription drug management.    Final diagnoses:  Febrile illness  Upper respiratory tract infection, unspecified type  Nausea    ED Discharge Orders          Ordered    azithromycin (ZITHROMAX) 250 MG tablet  Daily        04/21/24 2159    brompheniramine-pseudoephedrine-DM 30-2-10 MG/5ML syrup  4 times daily PRN        04/21/24 2159    ondansetron (ZOFRAN) 4 MG tablet  Every 6 hours        04/21/24 2159               Gennaro Duwaine CROME, DO 04/21/24 2209

## 2024-04-21 NOTE — ED Triage Notes (Signed)
 Pt arrives via POV. PT reports cough, chest congestion, and cough for the past 3 days.

## 2024-04-21 NOTE — Discharge Instructions (Signed)
 Take your antibiotics as prescribed.  You can use Zofran as needed for nausea and vomiting.  Take your cough medicine as needed for cough.  Follow-up with your primary care doctor.  Is important that you drink a lot of fluids and alternate Tylenol  and Motrin  every 3 hours as needed for fever.  Return to the ER if your symptoms worsen.

## 2024-04-21 NOTE — ED Provider Triage Note (Signed)
 Emergency Medicine Provider Triage Evaluation Note  Elijah Henderson , Henderson 33 y.o. male  was evaluated in triage.  Pt complains of cough, fever.  Patient reports that he was recently treated for Henderson sinus infection with what he believes was amoxicillin .  States that he had slight improvement in symptoms but they have now rebounded.  Noticing continued headache, sinus pressure, nasal drainage and discharge, and Henderson slight dry cough.  Denies any significant chest congestion.  He does report that he was around 1 individual who was sick about 1 week ago with more respiratory type symptoms such as cough and sore throat but denies any similar symptoms himself.  Review of Systems  Positive: As above Negative: As above  Physical Exam  BP 123/79 (BP Location: Left Arm)   Pulse (!) 117   Temp (!) 101.3 F (38.5 C)   Resp 18   SpO2 97%  Gen:   Awake, visibly uncomfortable Resp:  Normal effort, no wheezing, rales, rhonchi MSK:   Moves extremities without difficulty  Other:    Medical Decision Making  Medically screening exam initiated at 4:54 PM.  Appropriate orders placed.  Elijah Henderson was informed that the remainder of the evaluation will be completed by another provider, this initial triage assessment does not replace that evaluation, and the importance of remaining in the ED until their evaluation is complete.  Patient febrile at 101.3 F.  Tylenol  1000 mg given in triage.   Elijah Mccravy A, PA-C 04/21/24 1655

## 2024-04-21 NOTE — ED Notes (Signed)
 Pt states that he is feeling better, ice water given,  po intake encouraged

## 2024-04-21 NOTE — Progress Notes (Signed)
 [facility] Telephonic Visit NON VIDEO VISIT   Patient ID:  Elijah Henderson is a 33 y.o. (DOB May 27, 1991) male  Patient has been advised as to the limitations and limited nature of physical exam due to nature of a telephone visit, the possibility of privacy risk in the use of a telephone visit, and that the healthcare provider may recommend visiting a healthcare clinic for in-person care and follow up.  Telephonic Visit Assessment and Plan   1. Congestion of nasal sinus (Primary) 2. Chest congestion 3. Fever, unspecified fever cause    Sending to the ER for evaluation Obviously short of breath on the phone and having racking chilsl Suggested calling 911 patient declined   He stated he would call his mother to take him to the ER when she got off work   Risk, benefits, and alternatives were provided through patient instructions given to the patient during the telephone interaction.  If any worsening symptoms or lack of improvement, the patient will seek immediate medical care.  Telephonic Visit History   No chief complaint on file.   Fever  This is a new problem. The current episode started in the past 7 days. The problem occurs constantly. The problem has been gradually worsening. His temperature was unmeasured prior to arrival. Associated symptoms include chest pain, congestion, coughing, headaches and wheezing. Pertinent negatives include no abdominal pain, diarrhea, muscle aches, nausea, rash, sore throat, urinary pain or vomiting. He has tried acetaminophen  for the symptoms. The treatment provided no relief.  URI  This is a new problem. The current episode started 1 to 4 weeks ago. The problem has been gradually worsening. Associated symptoms include chest pain, congestion, coughing, headaches, rhinorrhea, sinus pain and wheezing. Pertinent negatives include no abdominal pain, diarrhea, dysuria, nausea, rash, sore throat or vomiting. He has tried acetaminophen  for the symptoms. The  treatment provided no relief.       Reviewed and updated this visit by provider: Tobacco  Allergies  Meds  Problems  Med Hx  Surg Hx  Fam Hx        ROS:  As documented in the history above, all other relevant system complaints were negative.  Telephonic Visit Objective Findings   This is a telephone visit. Examination conducted without the use of video cameras/computer monitors. Vital signs and other aspects of physical exam are limited due to the nature of this encounter.   Constitutional:  No apparent acute distress noted during the telephone interaction; Alert and verbally interactive. Mood:  Appears appropriate to situation.  Total time spent (established patient): 20 minutes

## 2024-04-26 LAB — CULTURE, BLOOD (ROUTINE X 2)
Culture: NO GROWTH
Culture: NO GROWTH
Special Requests: ADEQUATE
